# Patient Record
Sex: Female | Born: 1965 | Race: Black or African American | Hispanic: No | Marital: Single | Smoking: Never smoker
Health system: Southern US, Community
[De-identification: ages and names within clinical notes are randomized; demographics above are authoritative.]

## PROBLEM LIST (undated history)

## (undated) DIAGNOSIS — K589 Irritable bowel syndrome without diarrhea: Secondary | ICD-10-CM

## (undated) DIAGNOSIS — K5790 Diverticulosis of intestine, part unspecified, without perforation or abscess without bleeding: Secondary | ICD-10-CM

## (undated) DIAGNOSIS — K219 Gastro-esophageal reflux disease without esophagitis: Secondary | ICD-10-CM

## (undated) DIAGNOSIS — Z8742 Personal history of other diseases of the female genital tract: Secondary | ICD-10-CM

## (undated) DIAGNOSIS — Z87442 Personal history of urinary calculi: Secondary | ICD-10-CM

## (undated) DIAGNOSIS — F410 Panic disorder [episodic paroxysmal anxiety] without agoraphobia: Secondary | ICD-10-CM

## (undated) DIAGNOSIS — D219 Benign neoplasm of connective and other soft tissue, unspecified: Secondary | ICD-10-CM

## (undated) DIAGNOSIS — F419 Anxiety disorder, unspecified: Secondary | ICD-10-CM

## (undated) DIAGNOSIS — I10 Essential (primary) hypertension: Secondary | ICD-10-CM

## (undated) HISTORY — PX: BREAST CYST ASPIRATION: SHX578

## (undated) HISTORY — DX: Personal history of other diseases of the female genital tract: Z87.42

## (undated) HISTORY — PX: OTHER SURGICAL HISTORY: SHX169

## (undated) HISTORY — DX: Benign neoplasm of connective and other soft tissue, unspecified: D21.9

## (undated) HISTORY — DX: Essential (primary) hypertension: I10

---

## 1998-03-15 ENCOUNTER — Other Ambulatory Visit: Admission: RE | Admit: 1998-03-15 | Discharge: 1998-03-15 | Payer: Self-pay | Admitting: Obstetrics and Gynecology

## 1998-03-16 ENCOUNTER — Other Ambulatory Visit: Admission: RE | Admit: 1998-03-16 | Discharge: 1998-03-16 | Payer: Self-pay | Admitting: Obstetrics and Gynecology

## 1998-04-04 ENCOUNTER — Ambulatory Visit (HOSPITAL_COMMUNITY): Admission: RE | Admit: 1998-04-04 | Discharge: 1998-04-04 | Payer: Self-pay | Admitting: Obstetrics and Gynecology

## 1999-02-05 ENCOUNTER — Other Ambulatory Visit: Admission: RE | Admit: 1999-02-05 | Discharge: 1999-02-05 | Payer: Self-pay | Admitting: Obstetrics and Gynecology

## 1999-02-05 DIAGNOSIS — N870 Mild cervical dysplasia: Secondary | ICD-10-CM | POA: Insufficient documentation

## 1999-03-27 ENCOUNTER — Other Ambulatory Visit: Admission: RE | Admit: 1999-03-27 | Discharge: 1999-03-27 | Payer: Self-pay | Admitting: Obstetrics and Gynecology

## 1999-07-03 ENCOUNTER — Other Ambulatory Visit: Admission: RE | Admit: 1999-07-03 | Discharge: 1999-07-03 | Payer: Self-pay | Admitting: Obstetrics and Gynecology

## 1999-11-04 ENCOUNTER — Other Ambulatory Visit: Admission: RE | Admit: 1999-11-04 | Discharge: 1999-11-04 | Payer: Self-pay | Admitting: *Deleted

## 2000-02-13 ENCOUNTER — Other Ambulatory Visit: Admission: RE | Admit: 2000-02-13 | Discharge: 2000-02-13 | Payer: Self-pay | Admitting: Obstetrics and Gynecology

## 2000-05-20 ENCOUNTER — Other Ambulatory Visit: Admission: RE | Admit: 2000-05-20 | Discharge: 2000-05-20 | Payer: Self-pay | Admitting: Obstetrics & Gynecology

## 2001-02-15 ENCOUNTER — Other Ambulatory Visit: Admission: RE | Admit: 2001-02-15 | Discharge: 2001-02-15 | Payer: Self-pay | Admitting: Obstetrics and Gynecology

## 2002-02-15 ENCOUNTER — Other Ambulatory Visit: Admission: RE | Admit: 2002-02-15 | Discharge: 2002-02-15 | Payer: Self-pay | Admitting: Obstetrics and Gynecology

## 2003-02-28 ENCOUNTER — Other Ambulatory Visit: Admission: RE | Admit: 2003-02-28 | Discharge: 2003-02-28 | Payer: Self-pay | Admitting: Obstetrics and Gynecology

## 2006-06-01 ENCOUNTER — Ambulatory Visit (HOSPITAL_COMMUNITY): Admission: RE | Admit: 2006-06-01 | Discharge: 2006-06-01 | Payer: Self-pay | Admitting: Obstetrics and Gynecology

## 2007-06-03 ENCOUNTER — Ambulatory Visit (HOSPITAL_COMMUNITY): Admission: RE | Admit: 2007-06-03 | Discharge: 2007-06-03 | Payer: Self-pay | Admitting: Obstetrics and Gynecology

## 2008-06-08 ENCOUNTER — Ambulatory Visit (HOSPITAL_COMMUNITY): Admission: RE | Admit: 2008-06-08 | Discharge: 2008-06-08 | Payer: Self-pay | Admitting: Obstetrics and Gynecology

## 2009-05-15 ENCOUNTER — Ambulatory Visit: Payer: Self-pay | Admitting: Gastroenterology

## 2009-06-08 ENCOUNTER — Ambulatory Visit (HOSPITAL_COMMUNITY): Admission: RE | Admit: 2009-06-08 | Discharge: 2009-06-08 | Payer: Self-pay | Admitting: Obstetrics and Gynecology

## 2010-07-09 ENCOUNTER — Ambulatory Visit (HOSPITAL_COMMUNITY): Admission: RE | Admit: 2010-07-09 | Discharge: 2010-07-09 | Payer: Self-pay | Admitting: Obstetrics and Gynecology

## 2011-07-21 ENCOUNTER — Other Ambulatory Visit (HOSPITAL_COMMUNITY): Payer: Self-pay | Admitting: Obstetrics and Gynecology

## 2011-07-21 DIAGNOSIS — Z1231 Encounter for screening mammogram for malignant neoplasm of breast: Secondary | ICD-10-CM

## 2011-07-25 ENCOUNTER — Ambulatory Visit (HOSPITAL_COMMUNITY)
Admission: RE | Admit: 2011-07-25 | Discharge: 2011-07-25 | Disposition: A | Discharge status: Home / Self Care | Payer: 59 | Source: Ambulatory Visit | Attending: Obstetrics and Gynecology | Admitting: Obstetrics and Gynecology

## 2011-07-25 DIAGNOSIS — Z1231 Encounter for screening mammogram for malignant neoplasm of breast: Secondary | ICD-10-CM | POA: Insufficient documentation

## 2012-06-16 ENCOUNTER — Ambulatory Visit (INDEPENDENT_AMBULATORY_CARE_PROVIDER_SITE_OTHER): Payer: 59 | Admitting: Obstetrics and Gynecology

## 2012-06-16 ENCOUNTER — Encounter: Payer: Self-pay | Admitting: Obstetrics and Gynecology

## 2012-06-16 VITALS — BP 124/82 | Resp 16 | Ht 66.0 in | Wt 204.0 lb

## 2012-06-16 DIAGNOSIS — I10 Essential (primary) hypertension: Secondary | ICD-10-CM | POA: Insufficient documentation

## 2012-06-16 DIAGNOSIS — Z124 Encounter for screening for malignant neoplasm of cervix: Secondary | ICD-10-CM

## 2012-06-16 DIAGNOSIS — Z8742 Personal history of other diseases of the female genital tract: Secondary | ICD-10-CM | POA: Insufficient documentation

## 2012-06-16 DIAGNOSIS — Z01419 Encounter for gynecological examination (general) (routine) without abnormal findings: Secondary | ICD-10-CM

## 2012-06-16 DIAGNOSIS — E669 Obesity, unspecified: Secondary | ICD-10-CM

## 2012-06-16 DIAGNOSIS — D219 Benign neoplasm of connective and other soft tissue, unspecified: Secondary | ICD-10-CM | POA: Insufficient documentation

## 2012-06-16 DIAGNOSIS — R339 Retention of urine, unspecified: Secondary | ICD-10-CM

## 2012-06-16 NOTE — Addendum Note (Signed)
Addended by: Tim Lair on: 06/16/2012 06:09 PM   Modules accepted: Orders

## 2012-06-16 NOTE — Progress Notes (Signed)
Regular Periods: no Mammogram: Due 07/2012  Monthly Breast Ex.: yes Exercise: yes  Tetanus < 10 years: no Seatbelts: yes  NI. Bladder Functn.: yes Abuse at home: no  Daily BM's: yes Stressful Work: yes  Healthy Diet: yes Sigmoid-Colonoscopy: "2009" per pt WNL  Calcium: no Medical problems this year: Breast pain x 4-5 Weeks.    LAST PAP:06/2011 WNL  Contraception: Mirena  Mammogram:  07/25/2011  PCP: Dr.Hedrick [facility]  PMH: No Changes  FMH: No Changes  Last Bone Scan: N/A Subjective:    Chelsea Neal is a 46 y.o. female, No obstetric history on file., who presents for an annual exam. See above. She had a Mirena IUD placed in July of 2010.  She has a history of hypertension.  She was to rule out sexual transmitted infections.  She complains of urinary retention.  She denies dysuria, and hematuria.  Prior Hysterectomy: No    History   Social History  . Marital Status: Single    Spouse Name: N/A    Number of Children: N/A  . Years of Education: N/A   Social History Main Topics  . Smoking status: Never Smoker   . Smokeless tobacco: Never Used  . Alcohol Use: Yes  . Drug Use: No  . Sexually Active: None   Other Topics Concern  . None   Social History Narrative  . None    Menstrual cycle:   LMP: No LMP recorded. Patient is not currently having periods (Reason: IUD).           Cycle: No period with Mirena  The following portions of the patient's history were reviewed and updated as appropriate: allergies, current medications, past family history, past medical history, past social history, past surgical history and problem list.  Review of Systems Pertinent items are noted in HPI. Breast:Negative for breast lump,nipple discharge or nipple retraction Gastrointestinal: Negative for abdominal pain, change in bowel habits or rectal bleeding Urinary:negative   Objective:    BP 124/82  Resp 16  Ht 5\' 6"  (1.676 m)  Wt 204 lb (92.534 kg)  BMI 32.93 kg/m2    Weight:  Wt Readings from Last 1 Encounters:  06/16/12 204 lb (92.534 kg)          BMI: Body mass index is 32.93 kg/(m^2).  General Appearance: Alert, appropriate appearance for age. No acute distress HEENT: Grossly normal Neck / Thyroid: Supple, no masses, nodes or enlargement Lungs: clear to auscultation bilaterally Back: No CVA tenderness Breast Exam: No masses or nodes.No dimpling, nipple retraction or discharge. Cardiovascular: Regular rate and rhythm. S1, S2, no murmur Gastrointestinal: Soft, non-tender, no masses or organomegaly  ++++++++++++++++++++++++++++++++++++++++++++++++++++++++  Pelvic Exam: External genitalia: normal general appearance Vaginal: normal without tenderness, induration or masses and relaxation noted Cervix: normal appearance, IUD string present Adnexa: normal bimanual exam Uterus: 8 week size, irregular Rectovaginal: normal rectal, no masses  ++++++++++++++++++++++++++++++++++++++++++++++++++++++++  Lymphatic Exam: Non-palpable nodes in neck, clavicular, axillary, or inguinal regions Neurologic: Normal speech, no tremor  Psychiatric: Alert and oriented, appropriate affect.   Wet Prep:not applicable Urinalysis:not applicable UPT: Not done   Assessment:    Normal gyn exam   Overweight or obese: Yes   Pelvic relaxation: Yes  Fibroid uterus  Urinary retention   Plan:    mammogram pap smear return annually or prn Contraception:IUD  Urine culture sent   STD screen request: GC, chlamydia  RPR: No.   HBsAg: No.  Hepatitis C: No.  The updated Pap smear screening guidelines were  discussed with the patient. The patient requested that I obtain a Pap smear: Yes.  Kegel exercises discussed: Yes.  Proper diet and regular exercise were reviewed.  Annual mammograms recommended starting at age 66. Proper breast care was discussed.  Screening colonoscopy is recommended beginning at age 39.  Regular health maintenance was  reviewed.  Sleep hygiene was discussed.  Adequate calcium and vitamin D intake was emphasized.  Mylinda Latina.D.

## 2012-06-20 LAB — PAP IG, CT-NG, RFX HPV ASCU

## 2012-07-19 ENCOUNTER — Other Ambulatory Visit: Payer: Self-pay | Admitting: Obstetrics and Gynecology

## 2012-07-19 DIAGNOSIS — Z1231 Encounter for screening mammogram for malignant neoplasm of breast: Secondary | ICD-10-CM

## 2012-08-03 ENCOUNTER — Ambulatory Visit (HOSPITAL_COMMUNITY)
Admission: RE | Admit: 2012-08-03 | Discharge: 2012-08-03 | Disposition: A | Discharge status: Home / Self Care | Payer: 59 | Source: Ambulatory Visit | Attending: Obstetrics and Gynecology | Admitting: Obstetrics and Gynecology

## 2012-08-03 DIAGNOSIS — Z1231 Encounter for screening mammogram for malignant neoplasm of breast: Secondary | ICD-10-CM | POA: Insufficient documentation

## 2012-08-10 ENCOUNTER — Encounter: Payer: Self-pay | Admitting: Obstetrics and Gynecology

## 2013-08-15 ENCOUNTER — Other Ambulatory Visit: Payer: Self-pay | Admitting: Obstetrics and Gynecology

## 2013-08-15 DIAGNOSIS — Z1231 Encounter for screening mammogram for malignant neoplasm of breast: Secondary | ICD-10-CM

## 2013-08-23 ENCOUNTER — Other Ambulatory Visit: Payer: Self-pay | Admitting: Obstetrics and Gynecology

## 2013-08-23 ENCOUNTER — Ambulatory Visit (HOSPITAL_COMMUNITY)
Admission: RE | Admit: 2013-08-23 | Discharge: 2013-08-23 | Disposition: A | Discharge status: Home / Self Care | Payer: 59 | Source: Ambulatory Visit | Attending: Obstetrics and Gynecology | Admitting: Obstetrics and Gynecology

## 2013-08-23 ENCOUNTER — Ambulatory Visit (HOSPITAL_COMMUNITY): Payer: 59

## 2013-08-23 DIAGNOSIS — Z1231 Encounter for screening mammogram for malignant neoplasm of breast: Secondary | ICD-10-CM

## 2014-06-30 ENCOUNTER — Ambulatory Visit: Payer: Self-pay | Admitting: Family Medicine

## 2014-08-04 ENCOUNTER — Ambulatory Visit: Payer: Self-pay | Admitting: Gastroenterology

## 2014-08-22 ENCOUNTER — Other Ambulatory Visit (HOSPITAL_COMMUNITY): Payer: Self-pay | Admitting: Obstetrics and Gynecology

## 2014-08-22 DIAGNOSIS — Z1231 Encounter for screening mammogram for malignant neoplasm of breast: Secondary | ICD-10-CM

## 2014-08-25 ENCOUNTER — Ambulatory Visit (HOSPITAL_COMMUNITY)
Admission: RE | Admit: 2014-08-25 | Discharge: 2014-08-25 | Disposition: A | Discharge status: Home / Self Care | Payer: 59 | Source: Ambulatory Visit | Attending: Obstetrics and Gynecology | Admitting: Obstetrics and Gynecology

## 2014-08-25 DIAGNOSIS — Z1231 Encounter for screening mammogram for malignant neoplasm of breast: Secondary | ICD-10-CM | POA: Insufficient documentation

## 2015-08-24 ENCOUNTER — Other Ambulatory Visit (HOSPITAL_COMMUNITY): Payer: Self-pay | Admitting: Obstetrics and Gynecology

## 2015-08-24 DIAGNOSIS — Z1231 Encounter for screening mammogram for malignant neoplasm of breast: Secondary | ICD-10-CM

## 2015-08-28 ENCOUNTER — Ambulatory Visit (HOSPITAL_COMMUNITY)
Admission: RE | Admit: 2015-08-28 | Discharge: 2015-08-28 | Disposition: A | Discharge status: Home / Self Care | Payer: 59 | Source: Ambulatory Visit | Attending: Obstetrics and Gynecology | Admitting: Obstetrics and Gynecology

## 2015-08-28 ENCOUNTER — Other Ambulatory Visit (HOSPITAL_COMMUNITY): Payer: Self-pay | Admitting: Obstetrics and Gynecology

## 2015-08-28 DIAGNOSIS — Z1231 Encounter for screening mammogram for malignant neoplasm of breast: Secondary | ICD-10-CM | POA: Insufficient documentation

## 2016-08-01 ENCOUNTER — Emergency Department
Admission: EM | Admit: 2016-08-01 | Discharge: 2016-08-01 | Disposition: A | Discharge status: Home / Self Care | Payer: 59 | Attending: Emergency Medicine | Admitting: Emergency Medicine

## 2016-08-01 ENCOUNTER — Emergency Department: Payer: 59

## 2016-08-01 DIAGNOSIS — F419 Anxiety disorder, unspecified: Secondary | ICD-10-CM | POA: Diagnosis not present

## 2016-08-01 DIAGNOSIS — R0789 Other chest pain: Secondary | ICD-10-CM | POA: Diagnosis present

## 2016-08-01 DIAGNOSIS — I10 Essential (primary) hypertension: Secondary | ICD-10-CM | POA: Diagnosis not present

## 2016-08-01 DIAGNOSIS — R079 Chest pain, unspecified: Secondary | ICD-10-CM | POA: Diagnosis not present

## 2016-08-01 DIAGNOSIS — R200 Anesthesia of skin: Secondary | ICD-10-CM | POA: Insufficient documentation

## 2016-08-01 LAB — COMPREHENSIVE METABOLIC PANEL
ALBUMIN: 4.5 g/dL (ref 3.5–5.0)
ALT: 65 U/L — AB (ref 14–54)
AST: 38 U/L (ref 15–41)
Alkaline Phosphatase: 85 U/L (ref 38–126)
Anion gap: 6 (ref 5–15)
BILIRUBIN TOTAL: 0.8 mg/dL (ref 0.3–1.2)
BUN: 15 mg/dL (ref 6–20)
CO2: 27 mmol/L (ref 22–32)
CREATININE: 0.9 mg/dL (ref 0.44–1.00)
Calcium: 9.4 mg/dL (ref 8.9–10.3)
Chloride: 105 mmol/L (ref 101–111)
GFR calc Af Amer: >60 mL/min (ref >60)
GLUCOSE: 102 mg/dL — AB (ref 65–99)
POTASSIUM: 3.9 mmol/L (ref 3.5–5.1)
Sodium: 138 mmol/L (ref 135–145)
TOTAL PROTEIN: 8.3 g/dL — AB (ref 6.5–8.1)

## 2016-08-01 LAB — URINALYSIS COMPLETE WITH MICROSCOPIC (ARMC ONLY)
BACTERIA UA: NONE SEEN
SQUAMOUS EPITHELIAL / LPF: NONE SEEN
Specific Gravity, Urine: 1.015 (ref 1.005–1.030)
WBC, UA: NONE SEEN WBC/hpf (ref 0–5)

## 2016-08-01 LAB — CBC
HCT: 41.2 % (ref 35.0–47.0)
HEMOGLOBIN: 14.3 g/dL (ref 12.0–16.0)
MCH: 31.5 pg (ref 26.0–34.0)
MCHC: 34.6 g/dL (ref 32.0–36.0)
MCV: 90.9 fL (ref 80.0–100.0)
PLATELETS: 217 10*3/uL (ref 150–440)
RBC: 4.53 MIL/uL (ref 3.80–5.20)
RDW: 13.7 % (ref 11.5–14.5)
WBC: 5.3 10*3/uL (ref 3.6–11.0)

## 2016-08-01 LAB — TROPONIN I

## 2016-08-01 MED ORDER — LORAZEPAM 1 MG PO TABS: 1.0000 mg | ORAL_TABLET | Freq: Two times a day (BID) | ORAL | 0 refills | Status: AC | PRN

## 2016-08-01 NOTE — ED Provider Notes (Signed)
[facility] Emergency Department Provider Note   First MD Initiated Contact with Patient 08/01/16 [phone]     (approximate)  I have reviewed the triage vital signs and the nursing notes.   HISTORY  Chief Complaint Chest Pain and Numbness    HPI Chelsea Neal is a 50 y.o. female presents with chest tightness bilateral upper and lower extremity "numbness and tingling" intermittently, with hot flashes, feeling anxious 4 days. Patient states that she is under" a lot of stress" and feels as though her symptoms are secondary to anxiety.  Past Medical History:  Diagnosis Date  . Fibroids   . H/O menorrhagia   . Hypertension     Patient Active Problem List   Diagnosis Date Noted  . Obesity 06/16/2012  . Fibroids   . Hypertension   . H/O menorrhagia     Past Surgical History:  Procedure Laterality Date  . bucal tumor removal    . hammer toe repair      Prior to Admission medications   Medication Sig Start Date End Date Taking? Authorizing Provider  levonorgestrel (MIRENA) 20 MCG/24HR IUD 1 each by Intrauterine route once.    Historical Provider, MD    Allergies Latex  Family History  Problem Relation Age of Onset  . Cancer Maternal Grandmother     Breast     Social History Social History  Substance Use Topics  . Smoking status: Never Smoker  . Smokeless tobacco: Never Used  . Alcohol use Yes    Review of Systems Constitutional: No fever/chills Eyes: No visual changes. ENT: No sore throat. Cardiovascular: Positive for chest pain. Respiratory: Denies shortness of breath. Gastrointestinal: No abdominal pain.  No nausea, no vomiting.  No diarrhea.  No constipation. Genitourinary: Negative for dysuria. Musculoskeletal: Negative for back pain. Skin: Negative for rash. Neurological: Negative for headaches, focal weakness or numbness. Psychiatric:Positive for "anxiety"  10-point ROS otherwise  negative.  ____________________________________________   PHYSICAL EXAM:  VITAL SIGNS: ED Triage Vitals  Enc Vitals Group     BP 08/01/16 0012 (!) 141/86     Pulse Rate 08/01/16 0012 87     Resp 08/01/16 0012 18     Temp 08/01/16 0012 98.4 F (36.9 C)     Temp Source 08/01/16 0012 Oral     SpO2 08/01/16 0012 100 %     Weight 08/01/16 0009 195 lb (88.5 kg)     Height 08/01/16 0009 5\' 5"  (1.651 m)     Head Circumference --      Peak Flow --      Pain Score 08/01/16 0009 7     Pain Loc --      Pain Edu? --      Excl. in [place]? --     Constitutional: Alert and oriented. Well appearing and in no acute distress. Eyes: Conjunctivae are normal. PERRL. EOMI. Head: Atraumatic. Mouth/Throat: Mucous membranes are moist.  Oropharynx non-erythematous. Neck: No stridor.  No meningeal signs.   Cardiovascular: Normal rate, regular rhythm. Good peripheral circulation. Grossly normal heart sounds. Respiratory: Normal respiratory effort.  No retractions. Lungs CTAB. Gastrointestinal: Soft and nontender. No distention.  Musculoskeletal: No lower extremity tenderness nor edema. No gross deformities of extremities. Neurologic:  Normal speech and language. No gross focal neurologic deficits are appreciated.  Skin:  Skin is warm, dry and intact. No rash noted. Psychiatric: Anxious affect. Speech and behavior are normal.  ____________________________________________   LABS (all labs ordered are listed, but only abnormal  results are displayed)  Labs Reviewed  URINALYSIS COMPLETEWITH MICROSCOPIC ([place]) - Abnormal; Notable for the following:       Result Value   Color, Urine RED (*)    APPearance CLOUDY (*)    Glucose, UA   (*)    Value: TEST NOT REPORTED DUE TO COLOR INTERFERENCE OF URINE PIGMENT   Bilirubin Urine   (*)    Value: TEST NOT REPORTED DUE TO COLOR INTERFERENCE OF URINE PIGMENT   Ketones, ur   (*)    Value: TEST NOT REPORTED DUE TO COLOR INTERFERENCE OF URINE PIGMENT   Hgb  urine dipstick   (*)    Value: TEST NOT REPORTED DUE TO COLOR INTERFERENCE OF URINE PIGMENT   Protein, ur   (*)    Value: TEST NOT REPORTED DUE TO COLOR INTERFERENCE OF URINE PIGMENT   Nitrite   (*)    Value: TEST NOT REPORTED DUE TO COLOR INTERFERENCE OF URINE PIGMENT   Leukocytes, UA   (*)    Value: TEST NOT REPORTED DUE TO COLOR INTERFERENCE OF URINE PIGMENT   All other components within normal limits  COMPREHENSIVE METABOLIC PANEL - Abnormal; Notable for the following:    Glucose, Bld 102 (*)    Total Protein 8.3 (*)    ALT 65 (*)    All other components within normal limits  CBC  TROPONIN I  TROPONIN I   ____________________________________________  EKG  ED ECG REPORT I, [place] N Adler [place], the attending physician, personally viewed and interpreted this ECG.   Date: 08/01/2016  EKG Time: 12:12 AM  Rate: 88  Rhythm: Normal sinus rhythm  Axis: Normal  Intervals: Normal  ST&T Change: None  ____________________________________________  RADIOLOGY I, [place] N Giani Betzold, personally viewed and evaluated these images (plain radiographs) as part of my medical decision making, as well as reviewing the written report by the radiologist.  Dg Chest 2 View  Result Date: 08/01/2016 CLINICAL DATA:  Chest pain and bilateral leg numbness since last Sunday. EXAM: CHEST  2 VIEW COMPARISON:  None. FINDINGS: The heart size and mediastinal contours are within normal limits. Both lungs are clear. The visualized skeletal structures are unremarkable. IMPRESSION: No active cardiopulmonary disease. Electronically Signed   By: Lucienne Capers M.D.   On: 08/01/2016 00:41     Procedures     INITIAL IMPRESSION / ASSESSMENT AND PLAN / ED COURSE  Pertinent labs & imaging results that were available during my care of the patient were reviewed by me and considered in my medical decision making (see chart for details).  Given history and physical exam considered a possibility of cardiac etiology for  the patient's chest pain however EKG revealed no evidence of ischemia or infarction laboratory data negative including troponin 2.   Clinical Course    ____________________________________________  FINAL CLINICAL IMPRESSION(S) / ED DIAGNOSES  Final diagnoses:  Anxiety  Chest pain, unspecified chest pain type     MEDICATIONS GIVEN DURING THIS VISIT:  Medications - No data to display   NEW OUTPATIENT MEDICATIONS STARTED DURING THIS VISIT:  New Prescriptions   No medications on file      Note:  This document was prepared using Dragon voice recognition software and may include unintentional dictation errors.    Gregor Hams, MD 08/01/16 [phone]

## 2016-08-01 NOTE — ED Triage Notes (Signed)
Pt presents to ED w/ c/o chest pain and bilateral leg numbness since last Sunday. Pt states she thought s/x's were related to "hot flashes". Pt went to [facility] on Tuesday dx/'d with Acid Reflux (was given GI cocktail and 1 Prilosec daily x1 week). Pt reports medications helped but similar s/x's have returned. Pt denies N/V, denies SHOB.

## 2016-09-04 ENCOUNTER — Other Ambulatory Visit: Payer: Self-pay | Admitting: Obstetrics and Gynecology

## 2016-09-04 DIAGNOSIS — Z1231 Encounter for screening mammogram for malignant neoplasm of breast: Secondary | ICD-10-CM

## 2016-09-17 ENCOUNTER — Emergency Department: Payer: 59

## 2016-09-17 ENCOUNTER — Encounter: Payer: Self-pay | Admitting: Emergency Medicine

## 2016-09-17 ENCOUNTER — Ambulatory Visit
Admission: RE | Admit: 2016-09-17 | Discharge: 2016-09-17 | Disposition: A | Discharge status: Home / Self Care | Payer: 59 | Source: Ambulatory Visit | Attending: Obstetrics and Gynecology | Admitting: Obstetrics and Gynecology

## 2016-09-17 ENCOUNTER — Emergency Department
Admission: EM | Admit: 2016-09-17 | Discharge: 2016-09-17 | Disposition: A | Discharge status: Home / Self Care | Payer: 59 | Attending: Emergency Medicine | Admitting: Emergency Medicine

## 2016-09-17 DIAGNOSIS — Z91040 Latex allergy status: Secondary | ICD-10-CM | POA: Insufficient documentation

## 2016-09-17 DIAGNOSIS — R1013 Epigastric pain: Secondary | ICD-10-CM | POA: Insufficient documentation

## 2016-09-17 DIAGNOSIS — R112 Nausea with vomiting, unspecified: Secondary | ICD-10-CM | POA: Insufficient documentation

## 2016-09-17 DIAGNOSIS — I10 Essential (primary) hypertension: Secondary | ICD-10-CM | POA: Diagnosis not present

## 2016-09-17 DIAGNOSIS — Z79899 Other long term (current) drug therapy: Secondary | ICD-10-CM | POA: Diagnosis not present

## 2016-09-17 DIAGNOSIS — R1031 Right lower quadrant pain: Secondary | ICD-10-CM | POA: Insufficient documentation

## 2016-09-17 DIAGNOSIS — R1011 Right upper quadrant pain: Secondary | ICD-10-CM | POA: Diagnosis not present

## 2016-09-17 DIAGNOSIS — R109 Unspecified abdominal pain: Secondary | ICD-10-CM

## 2016-09-17 DIAGNOSIS — Z1231 Encounter for screening mammogram for malignant neoplasm of breast: Secondary | ICD-10-CM

## 2016-09-17 LAB — COMPREHENSIVE METABOLIC PANEL
ALT: 19 U/L (ref 14–54)
ANION GAP: 10 (ref 5–15)
AST: 26 U/L (ref 15–41)
Albumin: 4.1 g/dL (ref 3.5–5.0)
Alkaline Phosphatase: 93 U/L (ref 38–126)
BILIRUBIN TOTAL: 0.8 mg/dL (ref 0.3–1.2)
BUN: 12 mg/dL (ref 6–20)
CO2: 24 mmol/L (ref 22–32)
Calcium: 9.3 mg/dL (ref 8.9–10.3)
Chloride: 104 mmol/L (ref 101–111)
Creatinine, Ser: 0.95 mg/dL (ref 0.44–1.00)
GFR calc Af Amer: >60 mL/min (ref >60)
Glucose, Bld: 106 mg/dL — ABNORMAL HIGH (ref 65–99)
POTASSIUM: 3.8 mmol/L (ref 3.5–5.1)
Sodium: 138 mmol/L (ref 135–145)
TOTAL PROTEIN: 8.1 g/dL (ref 6.5–8.1)

## 2016-09-17 LAB — CBC WITH DIFFERENTIAL/PLATELET
Basophils Absolute: 0 10*3/uL (ref 0–0.1)
Basophils Relative: 0 %
Eosinophils Absolute: 0 10*3/uL (ref 0–0.7)
Eosinophils Relative: 0 %
HEMATOCRIT: 41 % (ref 35.0–47.0)
Hemoglobin: 14.1 g/dL (ref 12.0–16.0)
LYMPHS PCT: 11 %
Lymphs Abs: 1.2 10*3/uL (ref 1.0–3.6)
MCH: 30.4 pg (ref 26.0–34.0)
MCHC: 34.5 g/dL (ref 32.0–36.0)
MCV: 88.3 fL (ref 80.0–100.0)
MONO ABS: 0.8 10*3/uL (ref 0.2–0.9)
MONOS PCT: 7 %
NEUTROS ABS: 9.4 10*3/uL — AB (ref 1.4–6.5)
Neutrophils Relative %: 82 %
Platelets: 240 10*3/uL (ref 150–440)
RBC: 4.64 MIL/uL (ref 3.80–5.20)
RDW: 13.1 % (ref 11.5–14.5)
WBC: 11.4 10*3/uL — ABNORMAL HIGH (ref 3.6–11.0)

## 2016-09-17 LAB — URINALYSIS COMPLETE WITH MICROSCOPIC (ARMC ONLY)
BILIRUBIN URINE: NEGATIVE
Bacteria, UA: NONE SEEN
Glucose, UA: 50 mg/dL — AB
Hgb urine dipstick: NEGATIVE
KETONES UR: NEGATIVE mg/dL
Leukocytes, UA: NEGATIVE
Nitrite: NEGATIVE
PH: 6 (ref 5.0–8.0)
Protein, ur: 30 mg/dL — AB
Specific Gravity, Urine: 1.018 (ref 1.005–1.030)

## 2016-09-17 LAB — LIPASE, BLOOD: LIPASE: 17 U/L (ref 11–51)

## 2016-09-17 MED ORDER — IOPAMIDOL (ISOVUE-300) INJECTION 61%
100.0000 mL | Freq: Once | INTRAVENOUS | Status: AC | PRN
  Administered 2016-09-17: 100 mL via INTRAVENOUS

## 2016-09-17 MED ORDER — METOCLOPRAMIDE HCL 10 MG PO TABS: 10.0000 mg | ORAL_TABLET | Freq: Four times a day (QID) | ORAL | 0 refills | Status: DC | PRN

## 2016-09-17 MED ORDER — FAMOTIDINE 20 MG PO TABS: 20.0000 mg | ORAL_TABLET | Freq: Two times a day (BID) | ORAL | 0 refills | Status: DC

## 2016-09-17 MED ORDER — ONDANSETRON HCL 4 MG/2ML IJ SOLN
INTRAMUSCULAR | Status: AC
  Administered 2016-09-17: 4 mg
  Filled 2016-09-17: qty 2

## 2016-09-17 MED ORDER — MORPHINE SULFATE (PF) 2 MG/ML IV SOLN
INTRAVENOUS | Status: AC
  Administered 2016-09-17: 4 mg via INTRAVENOUS
  Filled 2016-09-17: qty 2

## 2016-09-17 MED ORDER — SUCRALFATE 1 G PO TABS: 1.0000 g | ORAL_TABLET | Freq: Four times a day (QID) | ORAL | 1 refills | Status: DC

## 2016-09-17 MED ORDER — IOPAMIDOL (ISOVUE-300) INJECTION 61%
30.0000 mL | Freq: Once | INTRAVENOUS | Status: AC
  Administered 2016-09-17: 30 mL via ORAL

## 2016-09-17 NOTE — ED Provider Notes (Signed)
[facility] Emergency Department Provider Note  ____________________________________________  Time seen: Approximately 4:17 PM  I have reviewed the triage vital signs and the nursing notes.   HISTORY  Chief Complaint Abdominal Pain    HPI Chelsea Neal is a 50 y.o. female who complains of right-sided abdominal pain that started yesterday afternoon. Associated with nausea and vomiting. Worse with eating and drinking. She's not been able to eat anything today. Was seen yesterday at primary care who noted she had a leukocytosis and advised to come to the ED for CT scan. As any significant surgical history medical history. Pain is nonradiating, no alleviating factors. Moderate intensity, aching and constant.     Past Medical History:  Diagnosis Date  . Fibroids   . H/O menorrhagia   . Hypertension      Patient Active Problem List   Diagnosis Date Noted  . Obesity 06/16/2012  . Fibroids   . Hypertension   . H/O menorrhagia      Past Surgical History:  Procedure Laterality Date  . bucal tumor removal    . hammer toe repair       Prior to Admission medications   Medication Sig Start Date End Date Taking? Authorizing Provider  acidophilus (RISAQUAD) CAPS capsule Take by mouth daily.   Yes Historical Provider, MD  calcium carbonate (OS-CAL - DOSED IN MG OF ELEMENTAL CALCIUM) 1250 (500 Ca) MG tablet Take 2 tablets by mouth.   Yes Historical Provider, MD  dexlansoprazole (DEXILANT) 60 MG capsule take 1 capsule by mouth 30-60 MINUTES PRIOR TO [place] 08/20/16  Yes Historical Provider, MD  Multiple Vitamin (MULTIVITAMIN) capsule Take 1 capsule by mouth daily.   Yes Historical Provider, MD  famotidine (PEPCID) 20 MG tablet Take 1 tablet (20 mg total) by mouth 2 (two) times daily. 09/17/16   Carrie Mew, MD  metoCLOPramide (REGLAN) 10 MG tablet Take 1 tablet (10 mg total) by mouth every 6 (six) hours as needed. 09/17/16   Carrie Mew, MD   sucralfate (CARAFATE) 1 g tablet Take 1 tablet (1 g total) by mouth 4 (four) times daily. 09/17/16   Carrie Mew, MD     Allergies Latex   Family History  Problem Relation Age of Onset  . Cancer Maternal Grandmother     Breast     Social History Social History  Substance Use Topics  . Smoking status: Never Smoker  . Smokeless tobacco: Never Used  . Alcohol use Yes    Review of Systems  Constitutional:   No fever or chills.  ENT:   No sore throat. No rhinorrhea. Cardiovascular:   No chest pain. Respiratory:   No dyspnea or cough. Gastrointestinal:   Positive abdominal pain with vomiting.  Genitourinary:   Negative for dysuria or difficulty urinating. 10-point ROS otherwise negative.  ____________________________________________   PHYSICAL EXAM:  VITAL SIGNS: ED Triage Vitals  Enc Vitals Group     BP 09/17/16 1013 (!) 144/86     Pulse Rate 09/17/16 1013 (!) 59     Resp 09/17/16 1013 18     Temp 09/17/16 1013 97.8 F (36.6 C)     Temp Source 09/17/16 1013 Oral     SpO2 09/17/16 1013 100 %     Weight 09/17/16 1014 196 lb (88.9 kg)     Height 09/17/16 1014 5\' 6"  (1.676 m)     Head Circumference --      Peak Flow --      Pain Score 09/17/16  1014 9     Pain Loc --      Pain Edu? --      Excl. in [place]? --     Vital signs reviewed, nursing assessments reviewed.   Constitutional:   Alert and oriented. Well appearing and in no distress. Eyes:   No scleral icterus. No conjunctival pallor. PERRL. EOMI.  No nystagmus. ENT   Head:   Normocephalic and atraumatic.   Nose:   No congestion/rhinnorhea. No septal hematoma   Mouth/Throat:   MMM, no pharyngeal erythema. No peritonsillar mass.    Neck:   No stridor. No SubQ emphysema. No meningismus. Hematological/Lymphatic/Immunilogical:   No cervical lymphadenopathy. Cardiovascular:   RRR. Symmetric bilateral radial and DP pulses.  No murmurs.  Respiratory:   Normal respiratory effort without tachypnea  nor retractions. Breath sounds are clear and equal bilaterally. No wheezes/rales/rhonchi.Chest wall nontender Gastrointestinal:   Soft With diffuse abdominal tenderness in the right upper right lower and epigastric areas.. Non distended. There is no CVA tenderness.  No rebound, rigidity, or guarding. Genitourinary:   deferred Musculoskeletal:   Nontender with normal range of motion in all extremities. No joint effusions.  No lower extremity tenderness.  No edema. Neurologic:   Normal speech and language.  CN 2-10 normal. Motor grossly intact. No gross focal neurologic deficits are appreciated.  Skin:    Skin is warm, dry and intact. No rash noted.  No petechiae, purpura, or bullae.  ____________________________________________    LABS (pertinent positives/negatives) (all labs ordered are listed, but only abnormal results are displayed) Labs Reviewed  URINALYSIS COMPLETEWITH MICROSCOPIC ([place]) - Abnormal; Notable for the following:       Result Value   Color, Urine YELLOW (*)    APPearance CLEAR (*)    Glucose, UA 50 (*)    Protein, ur 30 (*)    Squamous Epithelial / LPF 0-5 (*)    All other components within normal limits  CBC WITH DIFFERENTIAL/PLATELET - Abnormal; Notable for the following:    WBC 11.4 (*)    Neutro Abs 9.4 (*)    All other components within normal limits  COMPREHENSIVE METABOLIC PANEL - Abnormal; Notable for the following:    Glucose, Bld 106 (*)    All other components within normal limits  LIPASE, BLOOD  POC URINE PREG, ED   ____________________________________________   EKG    ____________________________________________    RADIOLOGY  CT abdomen and pelvis unremarkable Ultrasound right upper quadrant pending  ____________________________________________   PROCEDURES Procedures  ____________________________________________   INITIAL IMPRESSION / ASSESSMENT AND PLAN / ED COURSE  Pertinent labs & imaging results that were available  during my care of the patient were reviewed by me and considered in my medical decision making (see chart for details).  Patient uncomfortable but not in distress. Given morphine and Zofran for symptom control. Initial workup with labs and CT scan was unrevealing, and on reassessment of the patient had about 3:30 PM she had persistent right upper quadrant tenderness. Ultrasound of the right upper quadrant was ordered to evaluate for gallstones and biliary colic. Case signed out to Dr. Karma Greaser to follow-up on the ultrasound.   Very low suspicion for perforation obstruction ischemia or cholangitis. No evidence of GU or pelvic pathology.   Clinical Course   ____________________________________________   FINAL CLINICAL IMPRESSION(S) / ED DIAGNOSES  Final diagnoses:  Right sided abdominal pain       Portions of this note were generated with dragon dictation software. Dictation errors may  occur despite best attempts at proofreading.    Carrie Mew, MD 09/17/16 [phone]

## 2016-09-17 NOTE — ED Notes (Signed)
Pt says pain started yesterday at 1500 at work and pt vomited and continued throughout the night. Pt went to the PCP and WBC were elevated. Pt has been on acid reflux medication started August 2017.

## 2016-09-17 NOTE — ED Provider Notes (Signed)
----------------------------------------- 4:06 PM on 09/17/2016 -----------------------------------------   Blood pressure 118/74, pulse 60, temperature 98 F (36.7 C), temperature source Oral, resp. rate 16, height 5\' 6"  (1.676 m), weight 88.9 kg, last menstrual period 09/09/2016, SpO2 99 %.  Assuming care from Dr. Joni Fears.  In short, Chelsea Neal is a 50 y.o. female with a chief complaint of Abdominal Pain .  Refer to the original H&P for additional details.  The current plan of care is to follow up U/S and disposition appropriately.   ----------------------------------------- 5:36 PM on 09/17/2016 -----------------------------------------   Ct Abdomen Pelvis W Contrast  Result Date: 09/17/2016 CLINICAL DATA:  Vomiting. Elevated white blood cell count. History of diverticulitis. EXAM: CT ABDOMEN AND PELVIS WITH CONTRAST TECHNIQUE: Multidetector CT imaging of the abdomen and pelvis was performed using the standard protocol following bolus administration of intravenous contrast. CONTRAST:  152mL ISOVUE-300 IOPAMIDOL (ISOVUE-300) INJECTION 61% COMPARISON:  None. FINDINGS: Lower chest: Limited visualization of the lower thorax demonstrates 2 punctate (approximately 3-5 mm) nodules within the right middle (image 3, series 4) and lower (image 8) lobes. Minimal subsegmental atelectasis within the imaged bilateral lung bases, left greater than right. No focal airspace opacities. No pleural effusion. Borderline cardiomegaly.  No pericardial effusion. Hepatobiliary: Normal hepatic contour. No discrete hepatic lesions. No intra extrahepatic biliary duct dilatation. No ascites. Normal appearance of the gallbladder. No radiopaque gallstones. Pancreas: Normal appearance of the pancreas. Spleen: Normal appearance of the spleen. Adrenals/Urinary Tract: There is slightly delayed enhancement and excretion of the right kidney secondary to a approximately 0.8 x 0.6 cm stone within the superior aspect of the  right ureter (coronal image 43, series 5) an additional punctate (approximately 0.5 cm) stone within the mid aspect of the right ureter which result in moderate upstream ureterectasis and pelvicaliectasis. No additional renal stones are identified. No evidence of left-sided urinary obstruction. Mild asymmetric left-sided perinephric stranding. Normal appearance of the urinary bladder given degree distention. Normal appearance of the bilateral adrenal glands. Stomach/Bowel: Ingested enteric contrast extensive the level of the mid/distal small bowel. Moderate to large colonic stool burden without evidence of enteric obstruction. Normal appearance of the terminal ileum and appendix. No pneumoperitoneum, pneumatosis or portal venous gas. Vascular/Lymphatic: Normal caliber of the abdominal aorta. The major branch vessels of the abdominal aorta appear patent on this non CTA examination. No bulky retroperitoneal, mesenteric, pelvic or inguinal lymphadenopathy. Reproductive: Myomatous uterus with dominant fibroid within the anterior aspect of the uterine body measuring approximately 5.4 x 4.2 cm (sagittal image 59, series 6). No discrete adnexal lesion. No free fluid in the pelvic cul-de-sac. Other: Regional soft tissues appear normal. Musculoskeletal: No acute or aggressive osseous abnormalities. IMPRESSION: 1. Punctate (approximately 0.8 and 0.5 cm) stones within superior and mid aspects of the right ureter result in moderate upstream ureterectasis and pelvicaliectasis. 2. No additional evidence of nephrolithiasis. No evidence of left-sided urinary obstruction. 3. Myomatous uterus. Electronically Signed   By: Sandi Mariscal M.D.   On: 09/17/2016 15:17   US Abdomen Limited Ruq  Result Date: 09/17/2016 CLINICAL DATA:  Right-sided abdominal pain EXAM: US ABDOMEN LIMITED - RIGHT UPPER QUADRANT COMPARISON:  CT from earlier in the same day. FINDINGS: Gallbladder: No gallstones or wall thickening visualized. No sonographic  Murphy sign noted by sonographer. Common bile duct: Diameter: 4.5 mm. Liver: No focal lesion identified. Within normal limits in parenchymal echogenicity. Incidental note is made of right-sided hydronephrosis similar to that seen on recent CT examination. IMPRESSION: Right hydronephrosis secondary to known right-sided calculi  No other focal abnormality is seen. Electronically Signed   By: Inez Catalina M.D.   On: 09/17/2016 16:51     U/S unremarkable.  Patient in NAD.  Discussed results, she agrees with outpt f/u.  Gave usual/customary return precautions.   Hinda Kehr, MD 09/17/16 [phone]

## 2016-09-17 NOTE — ED Notes (Signed)
Pt started 2nd bottle of oral contrast

## 2016-09-17 NOTE — ED Triage Notes (Signed)
C/o right side abd pain since yesterday. Vomiting yesterday. Sent from clinic in [place] with elevated WBC

## 2016-09-17 NOTE — Discharge Instructions (Signed)

## 2016-09-30 ENCOUNTER — Ambulatory Visit (INDEPENDENT_AMBULATORY_CARE_PROVIDER_SITE_OTHER): Payer: 59 | Admitting: Urology

## 2016-09-30 ENCOUNTER — Encounter: Payer: Self-pay | Admitting: Urology

## 2016-09-30 VITALS — BP 128/82 | HR 73 | Ht 66.0 in | Wt 192.8 lb

## 2016-09-30 DIAGNOSIS — R3129 Other microscopic hematuria: Secondary | ICD-10-CM | POA: Diagnosis not present

## 2016-09-30 DIAGNOSIS — K649 Unspecified hemorrhoids: Secondary | ICD-10-CM | POA: Insufficient documentation

## 2016-09-30 DIAGNOSIS — N201 Calculus of ureter: Secondary | ICD-10-CM | POA: Diagnosis not present

## 2016-09-30 DIAGNOSIS — N132 Hydronephrosis with renal and ureteral calculous obstruction: Secondary | ICD-10-CM

## 2016-09-30 DIAGNOSIS — K5790 Diverticulosis of intestine, part unspecified, without perforation or abscess without bleeding: Secondary | ICD-10-CM | POA: Insufficient documentation

## 2016-09-30 DIAGNOSIS — N63 Unspecified lump in unspecified breast: Secondary | ICD-10-CM | POA: Insufficient documentation

## 2016-09-30 DIAGNOSIS — J309 Allergic rhinitis, unspecified: Secondary | ICD-10-CM | POA: Insufficient documentation

## 2016-09-30 LAB — URINALYSIS, COMPLETE
Bilirubin, UA: NEGATIVE
GLUCOSE, UA: NEGATIVE
KETONES UA: NEGATIVE
Nitrite, UA: NEGATIVE
PROTEIN UA: NEGATIVE
SPEC GRAV UA: 1.01 (ref 1.005–1.030)
Urobilinogen, Ur: 0.2 mg/dL (ref 0.2–1.0)
pH, UA: 6 (ref 5.0–7.5)

## 2016-09-30 LAB — MICROSCOPIC EXAMINATION

## 2016-09-30 MED ORDER — TAMSULOSIN HCL 0.4 MG PO CAPS: 0.4000 mg | ORAL_CAPSULE | Freq: Every day | ORAL | 0 refills | Status: DC

## 2016-09-30 MED ORDER — OXYCODONE-ACETAMINOPHEN 10-325 MG PO TABS: 1.0000 | ORAL_TABLET | ORAL | 0 refills | Status: DC | PRN

## 2016-09-30 NOTE — Progress Notes (Signed)
09/30/2016 2:16 PM   Delmer Islam 1966/10/26 [id number]  Referring provider: Maryland Pink, MD [address] [facility] [place], [place] [address]  Chief Complaint  Patient presents with  . New Patient (Initial Visit)    kidney stone    HPI: Patient is a 50 year old African American female who presents today for nephrolithiasis.  Patient states the onset of the pain was two weeks ago.   It was aching, moderate intensity and constant.  The pain was located in the RUQ.    The pain was a 8/10.  Nothing made the pain better.   Eating made the pain worse.  She did have nausea and vomiting, but she denies fevers, chills and gross hematuria.    In the ED, she received morphine and Zofran.  UA noted 0-5 RBC's/hpf.  Serum creatinine was 0.95.   Prior serum creatinine 0.90.  CT scan with contrast noted two (approximately 0.8 and 0.5 cm) stones within superior and mid aspects of the right ureter result in moderate upstream ureterectasis and pelvicaliectasis. No additional evidence of nephrolithiasis. No evidence of left-sided urinary obstruction.  Myomatous uterus.  I have independently reviewed the films.     Today, she is having gnawing RUQ.  She is not experiencing fevers, chills, nausea or vomiting.   UA today noted 11-30 WBC's and 11-30 RBC's.    She have a prior history of stones.  One stone was present on an ultrasound in 2015.       PMH: Past Medical History:  Diagnosis Date  . Fibroids   . H/O menorrhagia   . Hypertension     Surgical History: Past Surgical History:  Procedure Laterality Date  . bucal tumor removal    . hammer toe repair      Home Medications:    Medication List       Accurate as of 09/30/16  2:16 PM. Always use your most recent med list.          acidophilus Caps capsule Take by mouth daily.   calcium carbonate 1250 (500 Ca) MG tablet Commonly known as:  OS-CAL - dosed in mg of elemental calcium Take 2 tablets by mouth.   DEXILANT 60 MG capsule Generic drug:  dexlansoprazole take 1 capsule by mouth 30-60 MINUTES PRIOR TO EVENING MEAL   famotidine 20 MG tablet Commonly known as:  PEPCID Take 1 tablet (20 mg total) by mouth 2 (two) times daily.   metoCLOPramide 10 MG tablet Commonly known as:  REGLAN Take 1 tablet (10 mg total) by mouth every 6 (six) hours as needed.   multivitamin capsule Take 1 capsule by mouth daily.   oxyCODONE-acetaminophen 10-325 MG tablet Commonly known as:  PERCOCET Take 1 tablet by mouth every 4 (four) hours as needed for pain.   sucralfate 1 g tablet Commonly known as:  CARAFATE Take 1 tablet (1 g total) by mouth 4 (four) times daily.   tamsulosin 0.4 MG Caps capsule Commonly known as:  FLOMAX Take 1 capsule (0.4 mg total) by mouth daily.   venlafaxine 37.5 MG tablet Commonly known as:  EFFEXOR Take by mouth.       Allergies:  Allergies  Allergen Reactions  . Latex Hives    Family History: Family History  Problem Relation Age of Onset  . Cancer Maternal Grandmother     Breast   . Prostate cancer Father   . Kidney cancer Neg Hx   . Bladder Cancer Neg Hx  Social History:  reports that she has never smoked. She has never used smokeless tobacco. She reports that she drinks alcohol. She reports that she does not use drugs.  ROS: UROLOGY Frequent Urination?: No Hard to postpone urination?: No Burning/pain with urination?: No Get up at night to urinate?: Yes Leakage of urine?: No Urine stream starts and stops?: No Trouble starting stream?: No Do you have to strain to urinate?: No Blood in urine?: No Urinary tract infection?: No Sexually transmitted disease?: No Injury to kidneys or bladder?: No Painful intercourse?: No Weak stream?: No Currently pregnant?: No Vaginal bleeding?: No Last menstrual period?: n  Gastrointestinal Nausea?: No Vomiting?: Yes Indigestion/heartburn?: Yes Diarrhea?: No Constipation?: Yes  Constitutional Fever:  No Night sweats?: No Weight loss?: Yes Fatigue?: No  Skin Skin rash/lesions?: No Itching?: No  Eyes Blurred vision?: No Double vision?: No  Ears/Nose/Throat Sore throat?: No Sinus problems?: No  Hematologic/Lymphatic Swollen glands?: No Easy bruising?: No  Cardiovascular Leg swelling?: No Chest pain?: No  Respiratory Cough?: No Shortness of breath?: No  Endocrine Excessive thirst?: No  Musculoskeletal Back pain?: Yes Joint pain?: No  Neurological Headaches?: No Dizziness?: No  Psychologic Depression?: No Anxiety?: No  Physical Exam: BP 128/82 (BP Location: Left Arm, Patient Position: Sitting, Cuff Size: Normal)   Pulse 73   Ht 5' 6"  (1.676 m)   Wt 192 lb 12.8 oz (87.5 kg)   LMP 09/09/2016 (Approximate)   BMI 31.12 kg/m   Constitutional: Well nourished. Alert and oriented, No acute distress. HEENT: [place] AT, moist mucus membranes. Trachea midline, no masses. Cardiovascular: No clubbing, cyanosis, or edema. Respiratory: Normal respiratory effort, no increased work of breathing. GI: Abdomen is soft, non tender, non distended, no abdominal masses. Liver and spleen not palpable.  No hernias appreciated.  Stool sample for occult testing is not indicated.   GU: No CVA tenderness.  No bladder fullness or masses.   Skin: No rashes, bruises or suspicious lesions. Lymph: No cervical or inguinal adenopathy. Neurologic: Grossly intact, no focal deficits, moving all 4 extremities. Psychiatric: Normal mood and affect.  Laboratory Data: Lab Results  Component Value Date   WBC 11.4 (H) 09/17/2016   HGB 14.1 09/17/2016   HCT 41.0 09/17/2016   MCV 88.3 09/17/2016   PLT 240 09/17/2016    Lab Results  Component Value Date   CREATININE 0.95 09/17/2016    Lab Results  Component Value Date   AST 26 09/17/2016   Lab Results  Component Value Date   ALT 19 09/17/2016    Urinalysis 11-30 WBC's and 11-30 RBC's.  See EPIC.    Pertinent Imaging: CLINICAL  DATA:  Vomiting. Elevated white blood cell count. History of diverticulitis.  EXAM: CT ABDOMEN AND PELVIS WITH CONTRAST  TECHNIQUE: Multidetector CT imaging of the abdomen and pelvis was performed using the standard protocol following bolus administration of intravenous contrast.  CONTRAST:  172m ISOVUE-300 IOPAMIDOL (ISOVUE-300) INJECTION 61%  COMPARISON:  None.  FINDINGS: Lower chest: Limited visualization of the lower thorax demonstrates 2 punctate (approximately 3-5 mm) nodules within the right middle (image 3, series 4) and lower (image 8) lobes. Minimal subsegmental atelectasis within the imaged bilateral lung bases, left greater than right. No focal airspace opacities. No pleural effusion.  Borderline cardiomegaly.  No pericardial effusion.  Hepatobiliary: Normal hepatic contour. No discrete hepatic lesions. No intra extrahepatic biliary duct dilatation. No ascites. Normal appearance of the gallbladder. No radiopaque gallstones.  Pancreas: Normal appearance of the pancreas.  Spleen: Normal appearance of the  spleen.  Adrenals/Urinary Tract: There is slightly delayed enhancement and excretion of the right kidney secondary to a approximately 0.8 x 0.6 cm stone within the superior aspect of the right ureter (coronal image 43, series 5) an additional punctate (approximately 0.5 cm) stone within the mid aspect of the right ureter which result in moderate upstream ureterectasis and pelvicaliectasis.  No additional renal stones are identified. No evidence of left-sided urinary obstruction. Mild asymmetric left-sided perinephric stranding.  Normal appearance of the urinary bladder given degree distention.  Normal appearance of the bilateral adrenal glands.  Stomach/Bowel: Ingested enteric contrast extensive the level of the mid/distal small bowel. Moderate to large colonic stool burden without evidence of enteric obstruction. Normal appearance of  the terminal ileum and appendix. No pneumoperitoneum, pneumatosis or portal venous gas.  Vascular/Lymphatic: Normal caliber of the abdominal aorta. The major branch vessels of the abdominal aorta appear patent on this non CTA examination. No bulky retroperitoneal, mesenteric, pelvic or inguinal lymphadenopathy.  Reproductive: Myomatous uterus with dominant fibroid within the anterior aspect of the uterine body measuring approximately 5.4 x 4.2 cm (sagittal image 59, series 6). No discrete adnexal lesion. No free fluid in the pelvic cul-de-sac.  Other: Regional soft tissues appear normal.  Musculoskeletal: No acute or aggressive osseous abnormalities.  IMPRESSION: 1. Punctate (approximately 0.8 and 0.5 cm) stones within superior and mid aspects of the right ureter result in moderate upstream ureterectasis and pelvicaliectasis. 2. No additional evidence of nephrolithiasis. No evidence of left-sided urinary obstruction. 3. Myomatous uterus.   Electronically Signed   By: Sandi Mariscal M.D.   On: 09/17/2016 15:17   Assessment & Plan:    Patient has decided on right ureteroscopy with laser lithotripsy with ureteral stent placement for definitive treatments of her right ureteral stones.    1. Right ureteral stones  - patient with two stones in the right ureter  - we discussed MET vs ESWL vs URS/LL/stent placement, success rates and risks of each procedure   - schedule right ureteroscopy with laser lithotripsy and ureteral stent placement  - explained to the patient how the procedure is performed and the risks involved  - informed patient that they will have a stent placed during the procedure and will remain in place after the procedure for a short time.   - stent may be removed in the office with a cystoscope or patient may be instructed to remove the stent themselves by the string  - described "stent pain" as feelings of needing to urinate/overactive bladder and a warm,  tingling sensation to intense pain in the affected flank  - residual stones within the kidney or ureter may be present after the procedure and may need to have these addressed at a different encounter  - injury to the ureter is the most common intra-operative risk, it may result in an open procedure to correct the defect  - infection and bleeding are also risks  - explained the risks of general anesthesia, such as: MI, CVA, paralysis, coma and/or death.  - advised to contact our office or seek treatment in the ED if becomes febrile or pain/ vomiting are difficult control in order to arrange for emergent/urgent intervention  - Urinalysis, Complete  - CULTURE, URINE COMPREHENSIVE  - patient given tamsulosin and Percocet 10/325, # 10  2. Right hydronephrosis  - obtain RUS to ensure the hydronephrosis has resolved once stent has been removed  3. Microscopic hematuria  - UA today demonstrates 11-30 RBC's/hpf  - continue to monitor  the patient's UA after the treatment/passage of the stone to ensure the hematuria has resolved  - if hematuria persists, we will pursue a hematuria workup with CT Urogram and cystoscopy if appropriate.   Return for right URS/LL/ureteral stent placement.  These notes generated with voice recognition software. I apologize for typographical errors.  Zara Council, [facility] [address] [place],  [address] [phone]

## 2016-10-01 ENCOUNTER — Telehealth: Payer: Self-pay | Admitting: Radiology

## 2016-10-01 ENCOUNTER — Other Ambulatory Visit: Payer: Self-pay | Admitting: Radiology

## 2016-10-01 DIAGNOSIS — N201 Calculus of ureter: Secondary | ICD-10-CM

## 2016-10-01 NOTE — Telephone Encounter (Signed)
Notified pt of surgery with Dr Erlene Quan at [facility] on 10/14/16, pre-admit testing appt on 10/03/16 @8 :00 & to call day prior to surgery for arrival time to [facility]. Pt voices understanding.

## 2016-10-01 NOTE — Telephone Encounter (Signed)
LMOM. Need to discuss surgery information. 

## 2016-10-03 ENCOUNTER — Encounter
Admission: RE | Admit: 2016-10-03 | Discharge: 2016-10-03 | Disposition: A | Discharge status: Home / Self Care | Payer: 59 | Source: Ambulatory Visit | Attending: Urology | Admitting: Urology

## 2016-10-03 DIAGNOSIS — N132 Hydronephrosis with renal and ureteral calculous obstruction: Secondary | ICD-10-CM | POA: Diagnosis not present

## 2016-10-03 DIAGNOSIS — Z01812 Encounter for preprocedural laboratory examination: Secondary | ICD-10-CM | POA: Insufficient documentation

## 2016-10-03 DIAGNOSIS — R3129 Other microscopic hematuria: Secondary | ICD-10-CM | POA: Diagnosis not present

## 2016-10-03 HISTORY — DX: Gastro-esophageal reflux disease without esophagitis: K21.9

## 2016-10-03 HISTORY — DX: Panic disorder (episodic paroxysmal anxiety): F41.0

## 2016-10-03 HISTORY — DX: Anxiety disorder, unspecified: F41.9

## 2016-10-03 HISTORY — DX: Irritable bowel syndrome, unspecified: K58.9

## 2016-10-03 HISTORY — DX: Diverticulosis of intestine, part unspecified, without perforation or abscess without bleeding: K57.90

## 2016-10-03 HISTORY — DX: Personal history of urinary calculi: Z87.442

## 2016-10-03 LAB — CULTURE, URINE COMPREHENSIVE

## 2016-10-03 NOTE — Patient Instructions (Signed)
  Your procedure is scheduled [id number] 14, 2017 (Tuesday) Report to Same Day Surgery 2nd floor [facility] To find out your arrival time please call [phone] between 1PM - 3PM on October 13, 2016 (Monday)  Remember: Instructions that are not followed completely may result in serious medical risk, up to and including death, or upon the discretion of your surgeon and anesthesiologist your surgery may need to be rescheduled.    _x___ 1. Do not eat food or drink liquids after midnight. No gum chewing or hard candies.     __x__ 2. No Alcohol for 24 hours before or after surgery.   __x__3. No Smoking for 24 prior to surgery.   ____  4. Bring all medications with you on the day of surgery if instructed.    __x__ 5. Notify your doctor if there is any change in your medical condition     (cold, fever, infections).     Do not wear jewelry, make-up, hairpins, clips or nail polish.  Do not wear lotions, powders, or perfumes. You may wear deodorant.  Do not shave 48 hours prior to surgery. Men may shave face and neck.  Do not bring valuables to the hospital.    [facility] is not responsible for any belongings or valuables.               Contacts, dentures or bridgework may not be worn into surgery.  Leave your suitcase in the car. After surgery it may be brought to your room.  For patients admitted to the hospital, discharge time is determined by your treatment team.   Patients discharged the day of surgery will not be allowed to drive home.    Please read over the following fact sheets that you were given:   [facility] Preparing for Surgery and or MRSA Information   _x___ Take these medicines the morning of surgery with A SIP OF WATER:    1. Dexilant  2.  3.  4.  5.  6.  ____Fleets enema or Magnesium Citrate as directed.   ___ Use CHG Soap or sage wipes as directed on instruction sheet   ____ Use inhalers on the day of surgery and bring to hospital day of surgery  ____  Stop metformin 2 days prior to surgery    ____ Take 1/2 of usual insulin dose the night before surgery and none on the morning of           surgery.   _x___ Stop aspirin or coumadin, or plavix (NO ASPIRIN)  x__ Stop Anti-inflammatories such as Advil, Aleve, Ibuprofen, Motrin, Naproxen,          Naprosyn, Goodies powders or aspirin products. Ok to take Tylenol.   _x___ Stop supplements until after surgery.  (STOP PROBIOTIC NOW)  ____ Bring C-Pap to the hospital.

## 2016-10-14 ENCOUNTER — Encounter: Payer: Self-pay | Admitting: *Deleted

## 2016-10-14 ENCOUNTER — Encounter
Admission: RE | Disposition: A | Discharge status: Home / Self Care | Payer: Self-pay | Source: Ambulatory Visit | Attending: Urology

## 2016-10-14 ENCOUNTER — Ambulatory Visit: Payer: 59 | Admitting: Certified Registered"

## 2016-10-14 ENCOUNTER — Ambulatory Visit
Admission: RE | Admit: 2016-10-14 | Discharge: 2016-10-14 | Disposition: A | Discharge status: Home / Self Care | Payer: 59 | Source: Ambulatory Visit | Attending: Urology | Admitting: Urology

## 2016-10-14 DIAGNOSIS — N132 Hydronephrosis with renal and ureteral calculous obstruction: Secondary | ICD-10-CM | POA: Diagnosis present

## 2016-10-14 DIAGNOSIS — Z803 Family history of malignant neoplasm of breast: Secondary | ICD-10-CM | POA: Diagnosis not present

## 2016-10-14 DIAGNOSIS — K219 Gastro-esophageal reflux disease without esophagitis: Secondary | ICD-10-CM | POA: Insufficient documentation

## 2016-10-14 DIAGNOSIS — I10 Essential (primary) hypertension: Secondary | ICD-10-CM | POA: Insufficient documentation

## 2016-10-14 DIAGNOSIS — D72829 Elevated white blood cell count, unspecified: Secondary | ICD-10-CM | POA: Insufficient documentation

## 2016-10-14 DIAGNOSIS — N3281 Overactive bladder: Secondary | ICD-10-CM | POA: Insufficient documentation

## 2016-10-14 DIAGNOSIS — N201 Calculus of ureter: Secondary | ICD-10-CM | POA: Diagnosis not present

## 2016-10-14 DIAGNOSIS — Z91040 Latex allergy status: Secondary | ICD-10-CM | POA: Insufficient documentation

## 2016-10-14 DIAGNOSIS — Z6830 Body mass index (BMI) 30.0-30.9, adult: Secondary | ICD-10-CM | POA: Insufficient documentation

## 2016-10-14 DIAGNOSIS — D259 Leiomyoma of uterus, unspecified: Secondary | ICD-10-CM | POA: Diagnosis not present

## 2016-10-14 DIAGNOSIS — Z87442 Personal history of urinary calculi: Secondary | ICD-10-CM | POA: Diagnosis not present

## 2016-10-14 DIAGNOSIS — Z8042 Family history of malignant neoplasm of prostate: Secondary | ICD-10-CM | POA: Diagnosis not present

## 2016-10-14 HISTORY — PX: CYSTOSCOPY/URETEROSCOPY/HOLMIUM LASER/STENT PLACEMENT: SHX6546

## 2016-10-14 LAB — POCT PREGNANCY, URINE: PREG TEST UR: NEGATIVE

## 2016-10-14 SURGERY — CYSTOSCOPY/URETEROSCOPY/HOLMIUM LASER/STENT PLACEMENT
Anesthesia: General | Laterality: Right

## 2016-10-14 MED ORDER — FENTANYL CITRATE (PF) 100 MCG/2ML IJ SOLN
25.0000 ug | INTRAMUSCULAR | Status: DC | PRN
  Administered 2016-10-14 (×2): 25 ug via INTRAVENOUS

## 2016-10-14 MED ORDER — ONDANSETRON HCL 4 MG/2ML IJ SOLN: 4.0000 mg | Freq: Once | INTRAMUSCULAR | Status: DC | PRN

## 2016-10-14 MED ORDER — PROPOFOL 10 MG/ML IV BOLUS
INTRAVENOUS | Status: DC | PRN
  Administered 2016-10-14: 160 mg via INTRAVENOUS

## 2016-10-14 MED ORDER — ONDANSETRON HCL 4 MG/2ML IJ SOLN
INTRAMUSCULAR | Status: DC | PRN
  Administered 2016-10-14: 4 mg via INTRAVENOUS

## 2016-10-14 MED ORDER — OXYBUTYNIN CHLORIDE 5 MG PO TABS: 5.0000 mg | ORAL_TABLET | Freq: Three times a day (TID) | ORAL | 0 refills | Status: DC | PRN

## 2016-10-14 MED ORDER — LABETALOL HCL 5 MG/ML IV SOLN
5.0000 mg | Freq: Once | INTRAVENOUS | Status: AC
  Administered 2016-10-14: 5 mg via INTRAVENOUS

## 2016-10-14 MED ORDER — FAMOTIDINE 20 MG PO TABS
20.0000 mg | ORAL_TABLET | Freq: Once | ORAL | Status: AC
  Administered 2016-10-14: 20 mg via ORAL

## 2016-10-14 MED ORDER — CEFAZOLIN SODIUM-DEXTROSE 2-4 GM/100ML-% IV SOLN
2.0000 g | INTRAVENOUS | Status: AC
  Administered 2016-10-14: 2 g via INTRAVENOUS

## 2016-10-14 MED ORDER — FENTANYL CITRATE (PF) 100 MCG/2ML IJ SOLN
INTRAMUSCULAR | Status: DC | PRN
  Administered 2016-10-14: 100 ug via INTRAVENOUS

## 2016-10-14 MED ORDER — MIDAZOLAM HCL 2 MG/2ML IJ SOLN
INTRAMUSCULAR | Status: DC | PRN
  Administered 2016-10-14: 2 mg via INTRAVENOUS

## 2016-10-14 MED ORDER — CEFAZOLIN SODIUM-DEXTROSE 2-4 GM/100ML-% IV SOLN
INTRAVENOUS | Status: AC
  Filled 2016-10-14: qty 100

## 2016-10-14 MED ORDER — LABETALOL HCL 5 MG/ML IV SOLN
INTRAVENOUS | Status: AC
  Filled 2016-10-14: qty 4

## 2016-10-14 MED ORDER — DEXAMETHASONE SODIUM PHOSPHATE 10 MG/ML IJ SOLN
INTRAMUSCULAR | Status: DC | PRN
  Administered 2016-10-14: 4 mg via INTRAVENOUS

## 2016-10-14 MED ORDER — LIDOCAINE HCL (CARDIAC) 20 MG/ML IV SOLN
INTRAVENOUS | Status: DC | PRN
  Administered 2016-10-14: 100 mg via INTRAVENOUS

## 2016-10-14 MED ORDER — DOCUSATE SODIUM 100 MG PO CAPS: 100.0000 mg | ORAL_CAPSULE | Freq: Two times a day (BID) | ORAL | 0 refills | Status: DC

## 2016-10-14 MED ORDER — HYDROCODONE-ACETAMINOPHEN 5-325 MG PO TABS: 1.0000 | ORAL_TABLET | Freq: Four times a day (QID) | ORAL | 0 refills | Status: DC | PRN

## 2016-10-14 MED ORDER — FAMOTIDINE 20 MG PO TABS
ORAL_TABLET | ORAL | Status: AC
  Administered 2016-10-14: 20 mg via ORAL
  Filled 2016-10-14: qty 1

## 2016-10-14 MED ORDER — IOTHALAMATE MEGLUMINE 43 % IV SOLN
INTRAVENOUS | Status: DC | PRN
  Administered 2016-10-14: 15 mL

## 2016-10-14 MED ORDER — LACTATED RINGERS IV SOLN
INTRAVENOUS | Status: DC
  Administered 2016-10-14: 10:00:00 via INTRAVENOUS

## 2016-10-14 MED ORDER — SUGAMMADEX SODIUM 200 MG/2ML IV SOLN
INTRAVENOUS | Status: DC | PRN
  Administered 2016-10-14: 170 mg via INTRAVENOUS

## 2016-10-14 MED ORDER — ROCURONIUM BROMIDE 100 MG/10ML IV SOLN
INTRAVENOUS | Status: DC | PRN
  Administered 2016-10-14: 5 mg via INTRAVENOUS
  Administered 2016-10-14: 40 mg via INTRAVENOUS
  Administered 2016-10-14 (×2): 5 mg via INTRAVENOUS

## 2016-10-14 MED ORDER — FENTANYL CITRATE (PF) 100 MCG/2ML IJ SOLN
INTRAMUSCULAR | Status: AC
Start: 2016-10-14 — End: 2016-10-14
  Administered 2016-10-14: 25 ug via INTRAVENOUS
  Filled 2016-10-14: qty 2

## 2016-10-14 SURGICAL SUPPLY — 34 items
BAG DRAIN CYSTO-URO LG1000N (MISCELLANEOUS) ×3 IMPLANT
BASKET ZERO TIP 1.9FR (BASKET) ×2 IMPLANT
BRUSH SCRUB EZ 1% IODOPHOR (MISCELLANEOUS) ×3 IMPLANT
BSKT STON RTRVL ZERO TP 1.9FR (BASKET) ×1
CATH URETL 5X70 OPEN END (CATHETERS) ×3 IMPLANT
CNTNR SPEC 2.5X3XGRAD LEK (MISCELLANEOUS) ×1
CONRAY 43 FOR UROLOGY 50M (MISCELLANEOUS) ×3 IMPLANT
CONT SPEC 4OZ STER OR WHT (MISCELLANEOUS) ×2
CONT SPEC 4OZ STRL OR WHT (MISCELLANEOUS) ×1
CONTAINER SPEC 2.5X3XGRAD LEK (MISCELLANEOUS) IMPLANT
DRAPE UTILITY 15X26 TOWEL STRL (DRAPES) ×3 IMPLANT
FIBER LASER LITHO 273 (Laser) ×2 IMPLANT
GLIDEWIRE STIFF .35X180X3 HYDR (WIRE) ×2 IMPLANT
GLOVE BIO SURGEON STRL SZ 6.5 (GLOVE) ×2 IMPLANT
GLOVE BIO SURGEONS STRL SZ 6.5 (GLOVE) ×1
GOWN STRL REUS W/ TWL LRG LVL3 (GOWN DISPOSABLE) ×2 IMPLANT
GOWN STRL REUS W/TWL LRG LVL3 (GOWN DISPOSABLE) ×6
GUIDEWIRE GREEN .038 145CM (MISCELLANEOUS) ×2 IMPLANT
GUIDEWIRE SUPER STIFF (WIRE) IMPLANT
INFUSOR MANOMETER BAG 3000ML (MISCELLANEOUS) ×3 IMPLANT
INTRODUCER DILATOR DOUBLE (INTRODUCER) IMPLANT
KIT RM TURNOVER CYSTO AR (KITS) ×3 IMPLANT
PACK CYSTO AR (MISCELLANEOUS) ×3 IMPLANT
SENSORWIRE 0.038 NOT ANGLED (WIRE) ×6
SET CYSTO W/LG BORE CLAMP LF (SET/KITS/TRAYS/PACK) ×3 IMPLANT
SHEATH URETERAL 12FRX35CM (MISCELLANEOUS) IMPLANT
SOL .9 NS 3000ML IRR  AL (IV SOLUTION) ×2
SOL .9 NS 3000ML IRR AL (IV SOLUTION) ×1
SOL .9 NS 3000ML IRR UROMATIC (IV SOLUTION) ×1 IMPLANT
STENT URET 6FRX24 CONTOUR (STENTS) ×2 IMPLANT
STENT URET 6FRX26 CONTOUR (STENTS) IMPLANT
SURGILUBE 2OZ TUBE FLIPTOP (MISCELLANEOUS) ×3 IMPLANT
WATER STERILE IRR 1000ML POUR (IV SOLUTION) ×3 IMPLANT
WIRE SENSOR 0.038 NOT ANGLED (WIRE) ×2 IMPLANT

## 2016-10-14 NOTE — OR Nursing (Signed)
Dr Erlene Quan in to talk with pt and gave prescriptions to family.

## 2016-10-14 NOTE — Interval H&P Note (Signed)
History and Physical Interval Note:  10/14/2016 10:46 AM  Chelsea Neal  has presented today for surgery, with the diagnosis of right ureteral stones  The various methods of treatment have been discussed with the patient and family. After consideration of risks, benefits and other options for treatment, the patient has consented to  Procedure(s): CYSTOSCOPY/URETEROSCOPY/HOLMIUM LASER/STENT PLACEMENT (Right) as a surgical intervention .  The patient's history has been reviewed, patient examined, no change in status, stable for surgery.  I have reviewed the patient's chart and labs.  Questions were answered to the patient's satisfaction.    RRR CTAB  Hollice Espy

## 2016-10-14 NOTE — Transfer of Care (Signed)
Immediate Anesthesia Transfer of Care Note  Patient: Chelsea Neal  Procedure(s) Performed: Procedure(s): CYSTOSCOPY/URETEROSCOPY/HOLMIUM LASER/STENT PLACEMENT (Right)  Patient Location: PACU  Anesthesia Type:General  Level of Consciousness: sedated and responds to stimulation  Airway & Oxygen Therapy: Patient Spontanous Breathing and Patient connected to face mask oxygen  Post-op Assessment: Report given to RN and Post -op Vital signs reviewed and stable  Post vital signs: Reviewed and stable  Last Vitals:  Vitals:   10/14/16 0928 10/14/16 1252  BP: 131/89 (!) 146/91  Pulse: 63 88  Resp: 16 (!) 7  Temp: 36.7 C     Last Pain: There were no vitals filed for this visit.       Complications: No apparent anesthesia complications

## 2016-10-14 NOTE — Anesthesia Postprocedure Evaluation (Signed)
Anesthesia Post Note  Patient: Chelsea Neal  Procedure(s) Performed: Procedure(s) (LRB): CYSTOSCOPY/URETEROSCOPY/HOLMIUM LASER/STENT PLACEMENT (Right)  Patient location during evaluation: PACU Anesthesia Type: General Level of consciousness: awake Pain management: pain level controlled Vital Signs Assessment: post-procedure vital signs reviewed and stable Respiratory status: spontaneous breathing Cardiovascular status: stable Anesthetic complications: no    Last Vitals:  Vitals:   10/14/16 1308 10/14/16 1323  BP: (!) 135/95 (!) 140/98  Pulse: 80 79  Resp: 18 12  Temp:      Last Pain:  Vitals:   10/14/16 1323  PainSc: 0-No pain                 VAN STAVEREN,Xsavier Seeley

## 2016-10-14 NOTE — H&P (View-Only) (Signed)
09/30/2016 2:16 PM   Chelsea Neal July 12, 1966 [id number]  Referring provider: Maryland Pink, MD [address] [facility] [place], [place] [address]  Chief Complaint  Patient presents with  . New Patient (Initial Visit)    kidney stone    HPI: Patient is a 50 year old African American female who presents today for nephrolithiasis.  Patient states the onset of the pain was two weeks ago.   It was aching, moderate intensity and constant.  The pain was located in the RUQ.    The pain was a 8/10.  Nothing made the pain better.   Eating made the pain worse.  She did have nausea and vomiting, but she denies fevers, chills and gross hematuria.    In the ED, she received morphine and Zofran.  UA noted 0-5 RBC's/hpf.  Serum creatinine was 0.95.   Prior serum creatinine 0.90.  CT scan with contrast noted two (approximately 0.8 and 0.5 cm) stones within superior and mid aspects of the right ureter result in moderate upstream ureterectasis and pelvicaliectasis. No additional evidence of nephrolithiasis. No evidence of left-sided urinary obstruction.  Myomatous uterus.  I have independently reviewed the films.     Today, she is having gnawing RUQ.  She is not experiencing fevers, chills, nausea or vomiting.   UA today noted 11-30 WBC's and 11-30 RBC's.    She have a prior history of stones.  One stone was present on an ultrasound in 2015.       PMH: Past Medical History:  Diagnosis Date  . Fibroids   . H/O menorrhagia   . Hypertension     Surgical History: Past Surgical History:  Procedure Laterality Date  . bucal tumor removal    . hammer toe repair      Home Medications:    Medication List       Accurate as of 09/30/16  2:16 PM. Always use your most recent med list.          acidophilus Caps capsule Take by mouth daily.   calcium carbonate 1250 (500 Ca) MG tablet Commonly known as:  OS-CAL - dosed in mg of elemental calcium Take 2 tablets by mouth.   DEXILANT 60 MG capsule Generic drug:  dexlansoprazole take 1 capsule by mouth 30-60 MINUTES PRIOR TO EVENING MEAL   famotidine 20 MG tablet Commonly known as:  PEPCID Take 1 tablet (20 mg total) by mouth 2 (two) times daily.   metoCLOPramide 10 MG tablet Commonly known as:  REGLAN Take 1 tablet (10 mg total) by mouth every 6 (six) hours as needed.   multivitamin capsule Take 1 capsule by mouth daily.   oxyCODONE-acetaminophen 10-325 MG tablet Commonly known as:  PERCOCET Take 1 tablet by mouth every 4 (four) hours as needed for pain.   sucralfate 1 g tablet Commonly known as:  CARAFATE Take 1 tablet (1 g total) by mouth 4 (four) times daily.   tamsulosin 0.4 MG Caps capsule Commonly known as:  FLOMAX Take 1 capsule (0.4 mg total) by mouth daily.   venlafaxine 37.5 MG tablet Commonly known as:  EFFEXOR Take by mouth.       Allergies:  Allergies  Allergen Reactions  . Latex Hives    Family History: Family History  Problem Relation Age of Onset  . Cancer Maternal Grandmother     Breast   . Prostate cancer Father   . Kidney cancer Neg Hx   . Bladder Cancer Neg Hx  Social History:  reports that she has never smoked. She has never used smokeless tobacco. She reports that she drinks alcohol. She reports that she does not use drugs.  ROS: UROLOGY Frequent Urination?: No Hard to postpone urination?: No Burning/pain with urination?: No Get up at night to urinate?: Yes Leakage of urine?: No Urine stream starts and stops?: No Trouble starting stream?: No Do you have to strain to urinate?: No Blood in urine?: No Urinary tract infection?: No Sexually transmitted disease?: No Injury to kidneys or bladder?: No Painful intercourse?: No Weak stream?: No Currently pregnant?: No Vaginal bleeding?: No Last menstrual period?: n  Gastrointestinal Nausea?: No Vomiting?: Yes Indigestion/heartburn?: Yes Diarrhea?: No Constipation?: Yes  Constitutional Fever:  No Night sweats?: No Weight loss?: Yes Fatigue?: No  Skin Skin rash/lesions?: No Itching?: No  Eyes Blurred vision?: No Double vision?: No  Ears/Nose/Throat Sore throat?: No Sinus problems?: No  Hematologic/Lymphatic Swollen glands?: No Easy bruising?: No  Cardiovascular Leg swelling?: No Chest pain?: No  Respiratory Cough?: No Shortness of breath?: No  Endocrine Excessive thirst?: No  Musculoskeletal Back pain?: Yes Joint pain?: No  Neurological Headaches?: No Dizziness?: No  Psychologic Depression?: No Anxiety?: No  Physical Exam: BP 128/82 (BP Location: Left Arm, Patient Position: Sitting, Cuff Size: Normal)   Pulse 73   Ht 5' 6"  (1.676 m)   Wt 192 lb 12.8 oz (87.5 kg)   LMP 09/09/2016 (Approximate)   BMI 31.12 kg/m   Constitutional: Well nourished. Alert and oriented, No acute distress. HEENT: [place] AT, moist mucus membranes. Trachea midline, no masses. Cardiovascular: No clubbing, cyanosis, or edema. Respiratory: Normal respiratory effort, no increased work of breathing. GI: Abdomen is soft, non tender, non distended, no abdominal masses. Liver and spleen not palpable.  No hernias appreciated.  Stool sample for occult testing is not indicated.   GU: No CVA tenderness.  No bladder fullness or masses.   Skin: No rashes, bruises or suspicious lesions. Lymph: No cervical or inguinal adenopathy. Neurologic: Grossly intact, no focal deficits, moving all 4 extremities. Psychiatric: Normal mood and affect.  Laboratory Data: Lab Results  Component Value Date   WBC 11.4 (H) 09/17/2016   HGB 14.1 09/17/2016   HCT 41.0 09/17/2016   MCV 88.3 09/17/2016   PLT 240 09/17/2016    Lab Results  Component Value Date   CREATININE 0.95 09/17/2016    Lab Results  Component Value Date   AST 26 09/17/2016   Lab Results  Component Value Date   ALT 19 09/17/2016    Urinalysis 11-30 WBC's and 11-30 RBC's.  See EPIC.    Pertinent Imaging: CLINICAL  DATA:  Vomiting. Elevated white blood cell count. History of diverticulitis.  EXAM: CT ABDOMEN AND PELVIS WITH CONTRAST  TECHNIQUE: Multidetector CT imaging of the abdomen and pelvis was performed using the standard protocol following bolus administration of intravenous contrast.  CONTRAST:  128m ISOVUE-300 IOPAMIDOL (ISOVUE-300) INJECTION 61%  COMPARISON:  None.  FINDINGS: Lower chest: Limited visualization of the lower thorax demonstrates 2 punctate (approximately 3-5 mm) nodules within the right middle (image 3, series 4) and lower (image 8) lobes. Minimal subsegmental atelectasis within the imaged bilateral lung bases, left greater than right. No focal airspace opacities. No pleural effusion.  Borderline cardiomegaly.  No pericardial effusion.  Hepatobiliary: Normal hepatic contour. No discrete hepatic lesions. No intra extrahepatic biliary duct dilatation. No ascites. Normal appearance of the gallbladder. No radiopaque gallstones.  Pancreas: Normal appearance of the pancreas.  Spleen: Normal appearance of the  spleen.  Adrenals/Urinary Tract: There is slightly delayed enhancement and excretion of the right kidney secondary to a approximately 0.8 x 0.6 cm stone within the superior aspect of the right ureter (coronal image 43, series 5) an additional punctate (approximately 0.5 cm) stone within the mid aspect of the right ureter which result in moderate upstream ureterectasis and pelvicaliectasis.  No additional renal stones are identified. No evidence of left-sided urinary obstruction. Mild asymmetric left-sided perinephric stranding.  Normal appearance of the urinary bladder given degree distention.  Normal appearance of the bilateral adrenal glands.  Stomach/Bowel: Ingested enteric contrast extensive the level of the mid/distal small bowel. Moderate to large colonic stool burden without evidence of enteric obstruction. Normal appearance of  the terminal ileum and appendix. No pneumoperitoneum, pneumatosis or portal venous gas.  Vascular/Lymphatic: Normal caliber of the abdominal aorta. The major branch vessels of the abdominal aorta appear patent on this non CTA examination. No bulky retroperitoneal, mesenteric, pelvic or inguinal lymphadenopathy.  Reproductive: Myomatous uterus with dominant fibroid within the anterior aspect of the uterine body measuring approximately 5.4 x 4.2 cm (sagittal image 59, series 6). No discrete adnexal lesion. No free fluid in the pelvic cul-de-sac.  Other: Regional soft tissues appear normal.  Musculoskeletal: No acute or aggressive osseous abnormalities.  IMPRESSION: 1. Punctate (approximately 0.8 and 0.5 cm) stones within superior and mid aspects of the right ureter result in moderate upstream ureterectasis and pelvicaliectasis. 2. No additional evidence of nephrolithiasis. No evidence of left-sided urinary obstruction. 3. Myomatous uterus.   Electronically Signed   By: Sandi Mariscal M.D.   On: 09/17/2016 15:17   Assessment & Plan:    Patient has decided on right ureteroscopy with laser lithotripsy with ureteral stent placement for definitive treatments of her right ureteral stones.    1. Right ureteral stones  - patient with two stones in the right ureter  - we discussed MET vs ESWL vs URS/LL/stent placement, success rates and risks of each procedure   - schedule right ureteroscopy with laser lithotripsy and ureteral stent placement  - explained to the patient how the procedure is performed and the risks involved  - informed patient that they will have a stent placed during the procedure and will remain in place after the procedure for a short time.   - stent may be removed in the office with a cystoscope or patient may be instructed to remove the stent themselves by the string  - described "stent pain" as feelings of needing to urinate/overactive bladder and a warm,  tingling sensation to intense pain in the affected flank  - residual stones within the kidney or ureter may be present after the procedure and may need to have these addressed at a different encounter  - injury to the ureter is the most common intra-operative risk, it may result in an open procedure to correct the defect  - infection and bleeding are also risks  - explained the risks of general anesthesia, such as: MI, CVA, paralysis, coma and/or death.  - advised to contact our office or seek treatment in the ED if becomes febrile or pain/ vomiting are difficult control in order to arrange for emergent/urgent intervention  - Urinalysis, Complete  - CULTURE, URINE COMPREHENSIVE  - patient given tamsulosin and Percocet 10/325, # 10  2. Right hydronephrosis  - obtain RUS to ensure the hydronephrosis has resolved once stent has been removed  3. Microscopic hematuria  - UA today demonstrates 11-30 RBC's/hpf  - continue to monitor  the patient's UA after the treatment/passage of the stone to ensure the hematuria has resolved  - if hematuria persists, we will pursue a hematuria workup with CT Urogram and cystoscopy if appropriate.   Return for right URS/LL/ureteral stent placement.  These notes generated with voice recognition software. I apologize for typographical errors.  Zara Council, [facility] [address], [place], [place] [address] [phone]

## 2016-10-14 NOTE — Anesthesia Preprocedure Evaluation (Signed)
Anesthesia Evaluation  Patient identified by MRN, date of birth, ID band Patient awake    Reviewed: Allergy & Precautions, NPO status , Patient's Chart, lab work & pertinent test results  Airway Mallampati: II       Dental  (+) Teeth Intact   Pulmonary neg pulmonary ROS,    breath sounds clear to auscultation       Cardiovascular Exercise Tolerance: Good hypertension, Pt. on medications  Rhythm:Regular Rate:Normal     Neuro/Psych negative neurological ROS     GI/Hepatic Neg liver ROS, GERD  Medicated,  Endo/Other  negative endocrine ROSMorbid obesity  Renal/GU negative Renal ROS     Musculoskeletal   Abdominal (+) + obese,   Peds negative pediatric ROS (+)  Hematology negative hematology ROS (+)   Anesthesia Other Findings   Reproductive/Obstetrics                             Anesthesia Physical Anesthesia Plan  ASA: II  Anesthesia Plan: General   Post-op Pain Management:    Induction: Intravenous  Airway Management Planned: Oral ETT  Additional Equipment:   Intra-op Plan:   Post-operative Plan: Extubation in OR  Informed Consent: I have reviewed the patients History and Physical, chart, labs and discussed the procedure including the risks, benefits and alternatives for the proposed anesthesia with the patient or authorized representative who has indicated his/her understanding and acceptance.     Plan Discussed with: CRNA  Anesthesia Plan Comments:         Anesthesia Quick Evaluation

## 2016-10-14 NOTE — Discharge Instructions (Signed)

## 2016-10-14 NOTE — Op Note (Signed)
Date of procedure: 10/14/16  Preoperative diagnosis:  1. Right ureteral stones 2 2. Right flank pain 3. Right hydroureteronephrosis   Postoperative diagnosis:  1. Same as above   Procedure: 1. Right ureteroscopy 2. Laser lithotripsy 3. Basket extraction of Stone fragment 4. Right retrograde pyelogram 5. Right ureteral stent placement  Surgeon: Hollice Espy, MD  Anesthesia: General  Complications: None  Intraoperative findings: 2 stones identified within the ureter, one within the distal ureter and one near the level of the UPJ with proximal ureteral fold/tortuosity.  EBL: Minimal  Specimens: Stone fragment  Drains: 6 x 24 French double-J ureteral stent on right  Indication: Chelsea Neal is a 50 y.o. patient with multiple right-sided obstructing ureteral calculi..  After reviewing the management options for treatment, she elected to proceed with the above surgical procedure(s). We have discussed the potential benefits and risks of the procedure, side effects of the proposed treatment, the likelihood of the patient achieving the goals of the procedure, and any potential problems that might occur during the procedure or recuperation. Informed consent has been obtained.  Description of procedure:  The patient was taken to the operating room and general anesthesia was induced.  The patient was placed in the dorsal lithotomy position, prepped and draped in the usual sterile fashion, and preoperative antibiotics were administered. A preoperative time-out was performed.   A 21 French cystoscope was advanced per urethra into the bladder. Attention was turned to the right ureteral orifice which was cannulated using a sensor wire up to level of the kidney. There is some manipulation needed to bypass the stone within the proximal ureter. Each of the stones, both near the level of the UPJ and down the distal ureter could be easily seen on fluoroscopy  Once the wire was securely in place  in the upper pole, was snapped in place as a safety wire. A semirigid 4.5 French ureteroscope was advanced alongside the wire into the distal ureter with his first stone was encountered. A 273  laser fiber was used using the settings of 0.8 J and 10 Hz to fragment the stone to proximally 10 pieces. Each of the pieces were then basketed out using a 1.9 [place] nitinol basket until there was no residual stone fragments appreciated. Then attempted to advance the ureteroscope up to the level of the proximal ureter about on the was able to advance it to the mid kidney. Then attempted to advance a Super Stiff wire but due to her ureteral fold within the proximal ureter confirmed on retrograde pyelogram, this wire did not go easily. It did coil near the level just distal to the stone.  An 8 French flexible ureteroscope was then advanced over this wire up to the proximal ureter where this ureteral fold was appreciated. There true lumen of the ureter was identified through which the safety wire was seen traversing. Under direct visualization, I was eventually able to pass an angled Glidewire up to level of the kidney and advance the flexible ureteroscope over this wire into the renal pelvis. Upon doing this, I did not the stone into the renal pelvis and ultimately into a midpole calyx. The 8 mm stone was then fragmented using settings of 0.2 J and 40 Hz and later 1 J and 15 Hz to dust the remaining particles. The stone was quite hard. At this point in time, there was no stone fragments which were much larger than the tip of the laser fiber which was deemed satisfactory. Each and every  calyx was then directly visualized. The scope was backed to the proximal ureter and a retrograde pyelogram was performed. This revealed some mild hydronephrosis without extravasation or filling defects. This was uses a roadmap to ensure that each never calyx had been visualized and there is no residual fragments. The scope was then backed down  the length of the ureter inspecting along the way. There were no ureteral injuries or additional stone fragments appreciated. Finally, a 6 x 24 French double-J ureteral stent was advanced over the wire up to level of the kidney. The wire was partially drawn until full coil was noted within the renal pelvis. The wires and fully withdrawn and a full coil was noted within the bladder. The bladder was then drained. The patient was then cleaned and dried, repositioned the supine position, reversed from anesthesia, taken to the PACU in stable condition.  Plan: Patient will follow-up next week for cystoscopy, stent removal. We'll perform a renal ultrasound in proximal May 4 weeks after stent removal to assess for any residual silent hydronephrosis.  Hollice Espy, M.D.

## 2016-10-14 NOTE — Anesthesia Procedure Notes (Signed)
Procedure Name: Intubation Performed by: Pierra Skora Pre-anesthesia Checklist: Patient identified, Patient being monitored, Timeout performed, Emergency Drugs available and Suction available Patient Re-evaluated:Patient Re-evaluated prior to inductionOxygen Delivery Method: Circle system utilized Preoxygenation: Pre-oxygenation with 100% oxygen Intubation Type: IV induction Ventilation: Mask ventilation without difficulty Laryngoscope Size: Mac and 3 Grade View: Grade I Tube type: Oral Tube size: 7.0 mm Number of attempts: 1 Airway Equipment and Method: Stylet Placement Confirmation: ETT inserted through vocal cords under direct vision,  positive ETCO2 and breath sounds checked- equal and bilateral Secured at: 22 cm Tube secured with: Tape Dental Injury: Teeth and Oropharynx as per pre-operative assessment        

## 2016-10-21 ENCOUNTER — Ambulatory Visit (INDEPENDENT_AMBULATORY_CARE_PROVIDER_SITE_OTHER): Payer: 59 | Admitting: Urology

## 2016-10-21 VITALS — BP 139/91 | HR 88 | Ht 66.0 in

## 2016-10-21 DIAGNOSIS — N200 Calculus of kidney: Secondary | ICD-10-CM

## 2016-10-21 DIAGNOSIS — N201 Calculus of ureter: Secondary | ICD-10-CM

## 2016-10-21 MED ORDER — CIPROFLOXACIN HCL 500 MG PO TABS
500.0000 mg | ORAL_TABLET | Freq: Once | ORAL | Status: AC
  Administered 2016-10-21: 500 mg via ORAL

## 2016-10-21 MED ORDER — LIDOCAINE HCL 2 % EX GEL
1.0000 "application " | Freq: Once | CUTANEOUS | Status: AC
  Administered 2016-10-21: 1 via URETHRAL

## 2016-10-21 NOTE — Progress Notes (Signed)
   10/21/16  CC:  Chief Complaint  Patient presents with  . Cysto Stent Removal     HPI:  50 yo Female with multiple right ureteral stones with associated hydroureteronephrosis who underwent right ureteroscopy on 10/14/2016. The procedure was uncomplicated.   She returns to the office today for cystoscopy, stent removal. She is been tolerating the stent well. No UTI symptoms. No fevers or chills.  Blood pressure (!) 139/91, pulse 88, height 5\' 6"  (1.676 m), last menstrual period 10/03/2016. NED. A&Ox3.   No respiratory distress   Abd soft, NT, ND Normal external genitalia with patent urethral meatus  Cystoscopy/ Stent removal procedure  Patient identification was confirmed, informed consent was obtained, and patient was prepped using Betadine solution.  Lidocaine jelly was administered per urethral meatus.    Preoperative abx where received prior to procedure.    Procedure: - Flexible cystoscope introduced, without any difficulty.   - Thorough search of the bladder revealed:    normal urethral meatus  Stent seen emanating from right ureteral orifice, grasped with stent graspers, and removed in entirety.     Post-Procedure: - Patient tolerated the procedure well   Assessment/ Plan:  1. Ureteral calculus, right Status post right ureteroscopy Stent removed today without complication Warning signs reviewed Follow-up in 4 weeks with renal ultrasound prior    Hollice Espy, MD

## 2016-10-22 LAB — URINALYSIS, COMPLETE
Bilirubin, UA: NEGATIVE
GLUCOSE, UA: NEGATIVE
Ketones, UA: NEGATIVE
NITRITE UA: NEGATIVE
Specific Gravity, UA: 1.02 (ref 1.005–1.030)
Urobilinogen, Ur: 0.2 mg/dL (ref 0.2–1.0)
pH, UA: 5 (ref 5.0–7.5)

## 2016-10-22 LAB — MICROSCOPIC EXAMINATION
Bacteria, UA: NONE SEEN
WBC, UA: >30 /hpf — AB (ref 0–?)

## 2016-10-27 LAB — STONE ANALYSIS
Ca Oxalate,Monohydr.: 97 %
Ca phos cry stone ql IR: 3 %
Stone Weight KSTONE: 2.6 mg

## 2016-11-12 HISTORY — PX: UPPER GI ENDOSCOPY: SHX6162

## 2016-11-13 ENCOUNTER — Other Ambulatory Visit: Payer: Self-pay | Admitting: Gastroenterology

## 2016-11-13 DIAGNOSIS — R1011 Right upper quadrant pain: Secondary | ICD-10-CM

## 2016-11-13 DIAGNOSIS — R110 Nausea: Secondary | ICD-10-CM

## 2016-11-18 ENCOUNTER — Ambulatory Visit
Admission: RE | Admit: 2016-11-18 | Discharge: 2016-11-18 | Disposition: A | Discharge status: Home / Self Care | Payer: 59 | Source: Ambulatory Visit | Attending: Urology | Admitting: Urology

## 2016-11-18 DIAGNOSIS — N201 Calculus of ureter: Secondary | ICD-10-CM | POA: Diagnosis present

## 2016-11-18 DIAGNOSIS — Z87442 Personal history of urinary calculi: Secondary | ICD-10-CM | POA: Diagnosis not present

## 2016-11-18 DIAGNOSIS — Z09 Encounter for follow-up examination after completed treatment for conditions other than malignant neoplasm: Secondary | ICD-10-CM | POA: Diagnosis not present

## 2016-11-21 ENCOUNTER — Encounter
Admission: RE | Admit: 2016-11-21 | Discharge: 2016-11-21 | Disposition: A | Discharge status: Home / Self Care | Payer: 59 | Source: Ambulatory Visit | Attending: Gastroenterology | Admitting: Gastroenterology

## 2016-11-21 DIAGNOSIS — R1011 Right upper quadrant pain: Secondary | ICD-10-CM | POA: Insufficient documentation

## 2016-11-21 DIAGNOSIS — R110 Nausea: Secondary | ICD-10-CM | POA: Diagnosis not present

## 2016-11-21 MED ORDER — TECHNETIUM TC 99M MEBROFENIN IV KIT
5.0000 | PACK | Freq: Once | INTRAVENOUS | Status: AC | PRN
  Administered 2016-11-21: 5.22 via INTRAVENOUS

## 2016-12-04 DIAGNOSIS — K219 Gastro-esophageal reflux disease without esophagitis: Secondary | ICD-10-CM | POA: Insufficient documentation

## 2016-12-04 DIAGNOSIS — Z87442 Personal history of urinary calculi: Secondary | ICD-10-CM | POA: Insufficient documentation

## 2016-12-05 ENCOUNTER — Ambulatory Visit (INDEPENDENT_AMBULATORY_CARE_PROVIDER_SITE_OTHER): Payer: 59 | Admitting: Urology

## 2016-12-05 VITALS — Ht 66.0 in | Wt 195.5 lb

## 2016-12-05 DIAGNOSIS — N200 Calculus of kidney: Secondary | ICD-10-CM

## 2016-12-05 NOTE — Progress Notes (Signed)
12/05/2016 5:01 PM   Delmer Islam [birth year] [id number]  Referring provider: Maryland Pink, MD [address] [facility] [place], [place] [address]  Chief Complaint  Patient presents with  . Follow-up    renal Ultrasound results     HPI: 51 year old female with multiple right ureteral stones with associated hydroureteronephrosis who underwent right ureteroscopy on 10/14/2016. The procedure was uncomplicated.   She returned to the office on 11/20/2016 for cystoscopy, stent removal.  Follow-up renal ultrasound on 11/18/2016 shows no evidence of hydronephrosis or residual stones.  Overall, she's doing very well. She denies any flank pain or urinary issues. No gross hematuria.  She is a known history of kidney stones on imaging but has never passed a stone spontaneously. This is her first urologic surgery.  She attributes the stones to recent significant weight loss and change in her diet. She is adopted a high protein, low carb diet. She does try to drink 16 ounces of water 4 daily. She does eat a high oxalate containing diet. She avoids salt.    PMH: Past Medical History:  Diagnosis Date  . Anxiety   . Diverticulosis   . Fibroids   . GERD (gastroesophageal reflux disease)   . H/O menorrhagia   . History of kidney stones   . Hypertension   . IBS (irritable bowel syndrome)   . Panic attack     Surgical History: Past Surgical History:  Procedure Laterality Date  . bucal tumor removal Right   . CYSTOSCOPY/URETEROSCOPY/HOLMIUM LASER/STENT PLACEMENT Right 10/14/2016   Procedure: CYSTOSCOPY/URETEROSCOPY/HOLMIUM LASER/STENT PLACEMENT;  Surgeon: Hollice Espy, MD;  Location: [facility] ORS;  Service: Urology;  Laterality: Right;  . hammer toe repair Left    Dr. Paulla Dolly, [place] Medications:  Allergies as of 12/05/2016      Reactions   Latex Hives      Medication List       Accurate as of 12/05/16  5:01 PM. Always use your most recent med list.            CALCIUM-VITAMIN D PO Take 2 tablets by mouth daily.   DEXILANT 60 MG capsule Generic drug:  dexlansoprazole take 1 capsule by mouth 30-60 MINUTES PRIOR TO EVENING MEAL   multivitamin capsule Take 1 capsule by mouth daily.   PHILLIPS COLON HEALTH PO Take 1 capsule by mouth daily.       Allergies:  Allergies  Allergen Reactions  . Latex Hives    Family History: Family History  Problem Relation Age of Onset  . Cancer Maternal Grandmother     Breast   . Prostate cancer Father   . Kidney cancer Neg Hx   . Bladder Cancer Neg Hx     Social History:  reports that she has never smoked. She has never used smokeless tobacco. She reports that she drinks alcohol. She reports that she does not use drugs.  ROS: UROLOGY Frequent Urination?: No Hard to postpone urination?: No Burning/pain with urination?: No Get up at night to urinate?: No Leakage of urine?: No Urine stream starts and stops?: No Trouble starting stream?: No Do you have to strain to urinate?: No Blood in urine?: No Urinary tract infection?: No Sexually transmitted disease?: No Injury to kidneys or bladder?: No Painful intercourse?: No Weak stream?: No Currently pregnant?: No Vaginal bleeding?: No Last menstrual period?: no  Gastrointestinal Nausea?: No Vomiting?: No Indigestion/heartburn?: Yes Diarrhea?: No Constipation?: No  Constitutional Fever: No Night sweats?: No Weight  loss?: No Fatigue?: No  Skin Skin rash/lesions?: No Itching?: Yes  Eyes Blurred vision?: No Double vision?: No  Ears/Nose/Throat Sore throat?: No Sinus problems?: Yes  Hematologic/Lymphatic Swollen glands?: No Easy bruising?: No  Cardiovascular Leg swelling?: No Chest pain?: No  Respiratory Cough?: No Shortness of breath?: No  Endocrine Excessive thirst?: No  Musculoskeletal Back pain?: No Joint pain?: No  Neurological Headaches?: No Dizziness?: No  Psychologic Depression?:  No Anxiety?: No  Physical Exam: Ht 5\' 6"  (1.676 m)   Wt 195 lb 8 oz (88.7 kg)   LMP 11/17/2016   BMI 31.55 kg/m   Constitutional:  Alert and oriented, No acute distress. HEENT: [place] AT, moist mucus membranes.  Trachea midline, no masses. Cardiovascular: No clubbing, cyanosis, or edema. Respiratory: Normal respiratory effort, no increased work of breathing. GI: Abdomen is soft, nontender, nondistended, no abdominal masses GU: No CVA tenderness. Skin: No rashes, bruises or suspicious lesions. Neurologic: Grossly intact, no focal deficits, moving all 4 extremities. Psychiatric: Normal mood and affect.  Laboratory Data: Lab Results  Component Value Date   WBC 11.4 (H) 09/17/2016   HGB 14.1 09/17/2016   HCT 41.0 09/17/2016   MCV 88.3 09/17/2016   PLT 240 09/17/2016    Lab Results  Component Value Date   CREATININE 0.95 09/17/2016    Pertinent Imaging: CLINICAL DATA:  Right stent placement  EXAM: RENAL / URINARY TRACT ULTRASOUND COMPLETE  COMPARISON:  09/17/2016  FINDINGS: Right Kidney:  Length: 11.4 cm. Echogenicity within normal limits. No mass or hydronephrosis visualized.  Left Kidney:  Length: 10.7 cm. Echogenicity within normal limits. No mass or hydronephrosis visualized.  Bladder:  Appears normal for degree of bladder distention.  IMPRESSION: Right hydronephrosis has resolved.  No acute abnormality.   Electronically Signed   By: Marybelle Killings M.D.   On: 11/19/2016 08:18  Renal ultrasound per [facility] today.  Assessment & Plan:    1. Nephrolithiasis S/p uncomplicated ureteroscopy Stone composition reviewed today, primarily calcium oxalate We discussed general stone prevention techniques including drinking plenty water with goal of producing 2.5 L urine daily, increased citric acid intake, avoidance of high oxalate containing foods, and decreased salt intake.  Information about dietary recommendations given today.  No indication for  metabolic workup as this is her first stone episode.  - DG Abd 1 View; Future   Return in about 1 year (around 12/05/2017) for KUB (shannon).  Hollice Espy, MD  [facility] [address] [place], [place] [address] [phone]

## 2016-12-09 ENCOUNTER — Encounter: Payer: Self-pay | Admitting: *Deleted

## 2016-12-24 ENCOUNTER — Ambulatory Visit (INDEPENDENT_AMBULATORY_CARE_PROVIDER_SITE_OTHER): Payer: 59 | Admitting: General Surgery

## 2016-12-24 ENCOUNTER — Encounter: Payer: Self-pay | Admitting: General Surgery

## 2016-12-24 VITALS — BP 128/80 | HR 72 | Resp 12 | Ht 66.0 in | Wt 200.0 lb

## 2016-12-24 DIAGNOSIS — R1011 Right upper quadrant pain: Secondary | ICD-10-CM | POA: Diagnosis not present

## 2016-12-24 NOTE — Progress Notes (Signed)
Patient ID: Chelsea Neal, female   DOB: [birth year], 51 y.o.   MRN: [id number]  Chief Complaint  Patient presents with  . Other    gall bladder    HPI Chelsea Neal is a 51 y.o. female.  Here today for evaluation of her gall bladder. She was seeing Laurine Blazer [facility] for GERD. See did go to the ED in October 2017 for right upper abdominal pain with vomiting. The patient at that time was identified with an obstructing right ureteral calculus. This required endoscopic resection with stent placement.   She is states the pain is intermittent, now occurring less than once / week and of a less severe nature than last fall. . Indigestion but no nausea or vomiting. She feels spicy and greasy foods may trigger the pain. Onset may be 20-30 minutes after a meal, lasting up to 30 minutes. She had a HIDA scan on 11-21-16. She states when they gave her the Ensure mix she had a 5/10 pain, This was not similar to the discomfort she had previously experienced.  Abdominal ultrasound was 09-17-16. She is lactose intolerant and avoids those foods. 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 She has also been treated for kidney stones by Dr Erlene Quan during all of the testing. HPI  Past Medical History:  Diagnosis Date  . Anxiety   . Diverticulosis   . Fibroids   . GERD (gastroesophageal reflux disease)   . H/O menorrhagia   . History of kidney stones   . Hypertension   . IBS (irritable bowel syndrome)    constipation  . Panic attack     Past Surgical History:  Procedure Laterality Date  . bucal tumor removal Right   . CYSTOSCOPY/URETEROSCOPY/HOLMIUM LASER/STENT PLACEMENT Right 10/14/2016   Procedure: CYSTOSCOPY/URETEROSCOPY/HOLMIUM LASER/STENT PLACEMENT;  Surgeon: Hollice Espy, MD;  Location: [facility] ORS;  Service: Urology;  Laterality: Right;  . hammer toe repair Left    Dr. Paulla Dolly, [facility]  . UPPER GI ENDOSCOPY  11/12/2016   Dr Donnella Sham    Family History  Problem Relation  Age of Onset  . Cancer Maternal Grandmother     Breast   . Prostate cancer Father   . Kidney cancer Neg Hx   . Bladder Cancer Neg Hx     Social History Social History  Substance Use Topics  . Smoking status: Never Smoker  . Smokeless tobacco: Never Used  . Alcohol use Yes     Comment: social    Allergies  Allergen Reactions  . Latex Hives    Current Outpatient Prescriptions  Medication Sig Dispense Refill  . dexlansoprazole (DEXILANT) 60 MG capsule take 1 capsule by mouth     No current facility-administered medications for this visit.     Review of Systems Review of Systems  Constitutional: Negative.   Respiratory: Negative.   Cardiovascular: Negative.   Gastrointestinal: Positive for abdominal pain and vomiting.    Blood pressure 128/80, pulse 72, resp. rate 12, height 5\' 6"  (1.676 m), weight 200 lb (90.7 kg), last menstrual period 12/20/2016.  Physical Exam Physical Exam  Constitutional: She is oriented to person, place, and time. She appears well-developed and well-nourished.  HENT:  Mouth/Throat: Oropharynx is clear and moist.  Eyes: Conjunctivae are normal. No scleral icterus.  Neck: Neck supple.  Cardiovascular: Normal rate, regular rhythm and normal heart sounds.   No lower leg edema  Pulmonary/Chest: Effort normal and breath sounds normal.  Abdominal: Soft. Bowel sounds are normal. There  is no tenderness.  Lymphadenopathy:    She has no cervical adenopathy.  Neurological: She is alert and oriented to person, place, and time.  Skin: Skin is warm and dry.  Psychiatric: Her behavior is normal.    Data Reviewed Upper endoscopy of 11/12/2016 reviewed. 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 Polyps 2.  Office notes from 09/01/2016 reviewed.  Abdominal ultrasound dated 09/17/2016 showed no biliary track abnormality.  HIDA scan dated 11/21/2016 showed an ejection fraction of 68%, reported abdominal pain post ensure ingestion 5/10.    Assessment    Intermittent right upper quadrant pain, resolving frequency and intensity.  Past history ureteral obstruction requiring stone extraction.  Upper GI symptoms markedly improved on present PPI therapy.    Plan    The patient may have symptomatic biliary tract disease, but I think the likelihood of benefit from elective cholecystectomy at this time is probably 50%.      With an ejection fraction in the 60% range and a variable history of fatty food intolerance, I'm not sure that looked if cholecystectomy is warranted at this time.  We did discuss the pros and cons of surgery as well as the risks associated with elective cholecystectomy.  Laparoscopic Cholecystectomy with Intraoperative Cholangiogram. The procedure, including it's potential risks and complications (including but not limited to infection, bleeding, injury to intra-abdominal organs or bile ducts, bile leak, poor cosmetic result, sepsis and death) were discussed with the patient in detail. Non-operative options, including their inherent risks (acute calculous cholecystitis with possible choledocholithiasis or gallstone pancreatitis, with the risk of ascending cholangitis, sepsis, and death) were discussed as well. The patient further understands that if it is technically not possible, or it is unsafe to proceed laparoscopically, that I will convert to an open cholecystectomy.  The patient expressed and understanding of what we discussed and wishes to wait to proceed with laparoscopic cholecystectomy.she was encouraged to call the office directly if she becomes more symptomatic or better appreciates a direct connection between dietary intake and episodes of right upper quadrant pain.  She will call if she decides to have surgery.  This information has been scribed by Karie Fetch RN, BSN,BC.   Robert Bellow 12/25/2016, 3:24 PM

## 2016-12-24 NOTE — Patient Instructions (Addendum)
The patient is aware to call back for any questions or concerns.  Laparoscopic Cholecystectomy Laparoscopic cholecystectomy is surgery to remove the gallbladder. The gallbladder is a pear-shaped organ that lies beneath the liver on the right side of the body. The gallbladder stores bile, which is a fluid that helps the body to digest fats. Cholecystectomy is often done for inflammation of the gallbladder (cholecystitis). This condition is usually caused by a buildup of gallstones (cholelithiasis) in the gallbladder. Gallstones can block the flow of bile, which can result in inflammation and pain. In severe cases, emergency surgery may be required. This procedure is done though small incisions in your abdomen (laparoscopic surgery). A thin scope with a camera (laparoscope) is inserted through one incision. Thin surgical instruments are inserted through the other incisions. In some cases, a laparoscopic procedure may be turned into a type of surgery that is done through a larger incision (open surgery). Tell a health care provider about:  Any allergies you have.  All medicines you are taking, including vitamins, herbs, eye drops, creams, and over-the-counter medicines.  Any problems you or family members have had with anesthetic medicines.  Any blood disorders you have.  Any surgeries you have had.  Any medical conditions you have.  Whether you are pregnant or may be pregnant. What are the risks? Generally, this is a safe procedure. However, problems may occur, including:  Infection.  Bleeding.  Allergic reactions to medicines.  Damage to other structures or organs.  A stone remaining in the common bile duct. The common bile duct carries bile from the gallbladder into the small intestine.  A bile leak from the cyst duct that is clipped when your gallbladder is removed. What happens before the procedure? Staying hydrated  Follow instructions from your health care provider about  hydration, which may include:  Up to 2 hours before the procedure - you may continue to drink clear liquids, such as water, clear fruit juice, black coffee, and plain tea. Eating and drinking restrictions  Follow instructions from your health care provider about eating and drinking, which may include:  8 hours before the procedure - stop eating heavy meals or foods such as meat, fried foods, or fatty foods.  6 hours before the procedure - stop eating light meals or foods, such as toast or cereal.  6 hours before the procedure - stop drinking milk or drinks that contain milk.  2 hours before the procedure - stop drinking clear liquids. Medicines   Ask your health care provider about:  Changing or stopping your regular medicines. This is especially important if you are taking diabetes medicines or blood thinners.  Taking medicines such as aspirin and ibuprofen. These medicines can thin your blood. Do not take these medicines before your procedure if your health care provider instructs you not to.  You may be given antibiotic medicine to help prevent infection. General instructions   Let your health care provider know if you develop a cold or an infection before surgery.  Plan to have someone take you home from the hospital or clinic.  Ask your health care provider how your surgical site will be marked or identified. What happens during the procedure?  To reduce your risk of infection:  Your health care team will wash or sanitize their hands.  Your skin will be washed with soap.  Hair may be removed from the surgical area.  An IV tube may be inserted into one of your veins.  You will be given   one or more of the following:  A medicine to help you relax (sedative).  A medicine to make you fall asleep (general anesthetic).  A breathing tube will be placed in your mouth.  Your surgeon will make several small cuts (incisions) in your abdomen.  The laparoscope will be  inserted through one of the small incisions. The camera on the laparoscope will send images to a TV screen (monitor) in the operating room. This lets your surgeon see inside your abdomen.  Air-like gas will be pumped into your abdomen. This will expand your abdomen to give the surgeon more room to perform the surgery.  Other tools that are needed for the procedure will be inserted through the other incisions. The gallbladder will be removed through one of the incisions.  Your common bile duct may be examined. If stones are found in the common bile duct, they may be removed.  After your gallbladder has been removed, the incisions will be closed with stitches (sutures), staples, or skin glue.  Your incisions may be covered with a bandage (dressing). The procedure may vary among health care providers and hospitals. What happens after the procedure?  Your blood pressure, heart rate, breathing rate, and blood oxygen level will be monitored until the medicines you were given have worn off.  You will be given medicines as needed to control your pain.  Do not drive for 24 hours if you were given a sedative. This information is not intended to replace advice given to you by your health care provider. Make sure you discuss any questions you have with your health care provider. Document Released: 11/17/2005 Document Revised: 06/08/2016 Document Reviewed: 05/05/2016 Elsevier Interactive Patient Education  2017 Elsevier Inc.   

## 2016-12-25 DIAGNOSIS — R1011 Right upper quadrant pain: Secondary | ICD-10-CM | POA: Insufficient documentation

## 2017-09-18 ENCOUNTER — Other Ambulatory Visit: Payer: Self-pay | Admitting: Obstetrics and Gynecology

## 2017-09-18 DIAGNOSIS — Z1231 Encounter for screening mammogram for malignant neoplasm of breast: Secondary | ICD-10-CM

## 2017-09-21 ENCOUNTER — Ambulatory Visit
Admission: RE | Admit: 2017-09-21 | Discharge: 2017-09-21 | Disposition: A | Discharge status: Home / Self Care | Payer: 59 | Source: Ambulatory Visit | Attending: Obstetrics and Gynecology | Admitting: Obstetrics and Gynecology

## 2017-09-21 DIAGNOSIS — Z1231 Encounter for screening mammogram for malignant neoplasm of breast: Secondary | ICD-10-CM

## 2017-12-02 NOTE — Progress Notes (Signed)
12/03/2017 4:40 PM   Chelsea Neal 30-Nov-1966 [id number]  Referring provider: Maryland Pink, MD [address] [facility] [place],  [address]  No chief complaint on file.   HPI: 52 yo AAF with a history of nephrolithiasis who presents today for a one year follow up.  Background history 52 year old female with multiple right ureteral stones with associated hydroureteronephrosis who underwent right ureteroscopy on 10/14/2016. The procedure was uncomplicated.   She returned to the office on 11/20/2016 for cystoscopy, stent removal.  Follow-up renal ultrasound on 11/18/2016 shows no evidence of hydronephrosis or residual stones.  Overall, she's doing very well. She denies any flank pain or urinary issues. No gross hematuria.  She is a known history of kidney stones on imaging but has never passed a stone spontaneously. This is her first urologic surgery.  She attributes the stones to recent significant weight loss and change in her diet. She is adopted a high protein, low carb diet. She does try to drink 16 ounces of water 4 daily. She does eat a high oxalate containing diet. She avoids salt.    Today, she is not experiencing flank pain, gross hematuria or had any passage of fragments.  Her only complaint today is nocturia x 1.  She has not had fevers, chills, nausea or vomiting.  She has not yet had her KUB.    PMH: Past Medical History:  Diagnosis Date  . Anxiety   . Diverticulosis   . Fibroids   . GERD (gastroesophageal reflux disease)   . H/O menorrhagia   . History of kidney stones   . Hypertension   . IBS (irritable bowel syndrome)    constipation  . Panic attack     Surgical History: Past Surgical History:  Procedure Laterality Date  . BREAST CYST ASPIRATION Left   . bucal tumor removal Right   . CYSTOSCOPY/URETEROSCOPY/HOLMIUM LASER/STENT PLACEMENT Right 10/14/2016   Procedure: CYSTOSCOPY/URETEROSCOPY/HOLMIUM LASER/STENT PLACEMENT;  Surgeon: Hollice Espy, MD;  Location: [facility] ORS;  Service: Urology;  Laterality: Right;  . hammer toe repair Left    Dr. Paulla Dolly, [facility]  . UPPER GI ENDOSCOPY  11/12/2016   Dr Donnella Sham    Home Medications:  Allergies as of 12/03/2017      Reactions   Latex Hives      Medication List        Accurate as of 12/03/17  4:40 PM. Always use your most recent med list.          Biotin 1000 MCG tablet Take by mouth.   cetirizine 10 MG tablet Commonly known as:  ZYRTEC Take by mouth.   DEXILANT 60 MG capsule Generic drug:  dexlansoprazole take 1 capsule by mouth       Allergies:  Allergies  Allergen Reactions  . Latex Hives    Family History: Family History  Problem Relation Age of Onset  . Prostate cancer Father   . Breast cancer Paternal Grandmother   . Kidney cancer Neg Hx   . Bladder Cancer Neg Hx     Social History:  reports that  has never smoked. she has never used smokeless tobacco. She reports that she drinks alcohol. She reports that she does not use drugs.  ROS: UROLOGY Frequent Urination?: No Hard to postpone urination?: No Burning/pain with urination?: No Get up at night to urinate?: Yes Leakage of urine?: No Urine stream starts and stops?: No Trouble starting stream?: No Do you have to strain to urinate?: No  Blood in urine?: No Urinary tract infection?: No Sexually transmitted disease?: No Injury to kidneys or bladder?: No Painful intercourse?: No Weak stream?: No Currently pregnant?: No Vaginal bleeding?: No Last menstrual period?: n  Gastrointestinal Nausea?: No Vomiting?: No Indigestion/heartburn?: No Diarrhea?: No Constipation?: No  Constitutional Fever: No Night sweats?: No Weight loss?: No Fatigue?: No  Skin Skin rash/lesions?: No Itching?: No  Eyes Blurred vision?: No Double vision?: No  Ears/Nose/Throat Sore throat?: No Sinus problems?: Yes  Hematologic/Lymphatic Swollen glands?: No Easy bruising?:  No  Cardiovascular Leg swelling?: No Chest pain?: No  Respiratory Cough?: No Shortness of breath?: No  Endocrine Excessive thirst?: No  Musculoskeletal Back pain?: Yes Joint pain?: No  Neurological Headaches?: No Dizziness?: No  Psychologic Depression?: No Anxiety?: No  Physical Exam: BP 140/90   Pulse 94   Ht 5\' 6"  (1.676 m)   Wt 215 lb (97.5 kg)   BMI 34.70 kg/m   Constitutional: Well nourished. Alert and oriented, No acute distress. HEENT: [place] AT, moist mucus membranes. Trachea midline, no masses. Cardiovascular: No clubbing, cyanosis, or edema. Respiratory: Normal respiratory effort, no increased work of breathing. Skin: No rashes, bruises or suspicious lesions. Lymph: No cervical or inguinal adenopathy. Neurologic: Grossly intact, no focal deficits, moving all 4 extremities. Psychiatric: Normal mood and affect.  Laboratory Data: No recent labs    Assessment & Plan:    1. History of nephrolithiasis  - S/p uncomplicated ureteroscopy  - Stone composition reviewed today, primarily calcium oxalate  - We reviewed general stone prevention techniques including drinking plenty water with goal of producing 2.5 L urine daily, increased citric acid intake, avoidance of high oxalate containing foods, and decreased salt intake.   - No indication for metabolic workup as this is her first stone episode but patient is interested and may pursue metabolic work up once she has moved and settled into her new home  - patient to get a KUB sometime this week  - RTC in one year if KUB is negative   Return in about 1 year (around 12/03/2018) for KUB.  Zara Council, [facility] [address], [place], [place] [address] [phone]

## 2017-12-03 ENCOUNTER — Ambulatory Visit (INDEPENDENT_AMBULATORY_CARE_PROVIDER_SITE_OTHER): Payer: Managed Care, Other (non HMO) | Admitting: Urology

## 2017-12-03 ENCOUNTER — Encounter: Payer: Self-pay | Admitting: Urology

## 2017-12-03 VITALS — BP 140/90 | HR 94 | Ht 66.0 in | Wt 215.0 lb

## 2017-12-03 DIAGNOSIS — Z87442 Personal history of urinary calculi: Secondary | ICD-10-CM | POA: Diagnosis not present

## 2017-12-11 ENCOUNTER — Telehealth: Payer: Self-pay | Admitting: Urology

## 2017-12-11 NOTE — Telephone Encounter (Signed)
Would you remind Mrs. Comacho she still needs a KUB?

## 2017-12-11 NOTE — Telephone Encounter (Signed)
Patient notified , she will go for KUB on monday

## 2017-12-14 ENCOUNTER — Ambulatory Visit
Admission: RE | Admit: 2017-12-14 | Discharge: 2017-12-14 | Disposition: A | Discharge status: Home / Self Care | Payer: Managed Care, Other (non HMO) | Source: Ambulatory Visit | Attending: Urology | Admitting: Urology

## 2017-12-14 DIAGNOSIS — N200 Calculus of kidney: Secondary | ICD-10-CM | POA: Insufficient documentation

## 2017-12-30 ENCOUNTER — Telehealth: Payer: Self-pay

## 2017-12-30 NOTE — Telephone Encounter (Signed)
Letter sent.

## 2017-12-30 NOTE — Telephone Encounter (Signed)
-----   Message from Hollice Espy, MD sent at 12/29/2017  5:08 PM EST ----- No kidney stones.  Awesome news.    Hollice Espy, MD

## 2018-01-07 IMAGING — CT CT ABD-PELV W/ CM
2 of 5 series · 15 of 46 positions shown, 17 images · IV contrast (APPLIED)
Comparison: None.

CLINICAL DATA: Vomiting. Elevated white blood cell count. History
of diverticulitis.

EXAM:
CT ABDOMEN AND PELVIS WITH CONTRAST
TECHNIQUE: Multidetector CT imaging of the abdomen and pelvis was performed
using the standard protocol following bolus administration of
intravenous contrast.
CONTRAST:  100mL 1OVQRA-PAA IOPAMIDOL (1OVQRA-PAA) INJECTION 61%

[Series 2: axial st · axial · 0.75mm/px · z∈[-7,+418]mm · 12 of 96 slices shown, 14 images]
[im 6/96  soft-tissue]
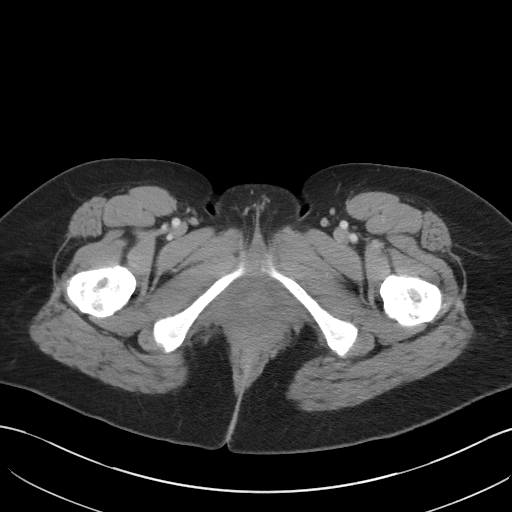
[im 6/96  bone]
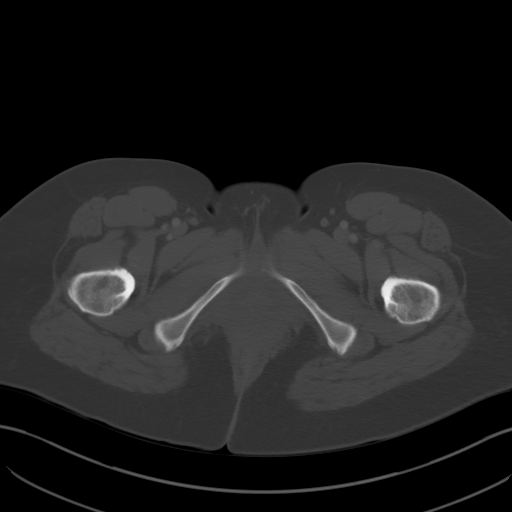
[im 16/96  soft-tissue]
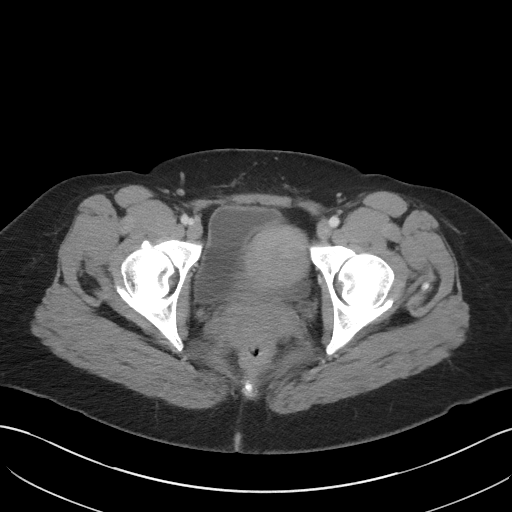
[im 21/96  soft-tissue]
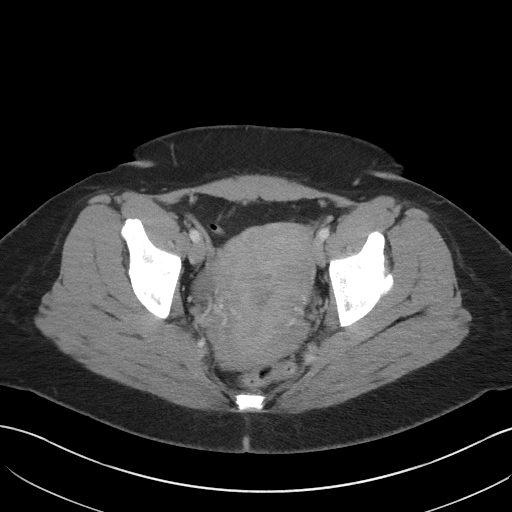
[im 31/96  soft-tissue]
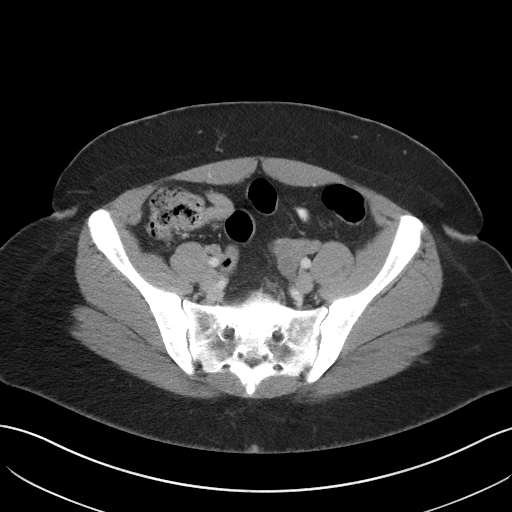
[im 36/96  soft-tissue]
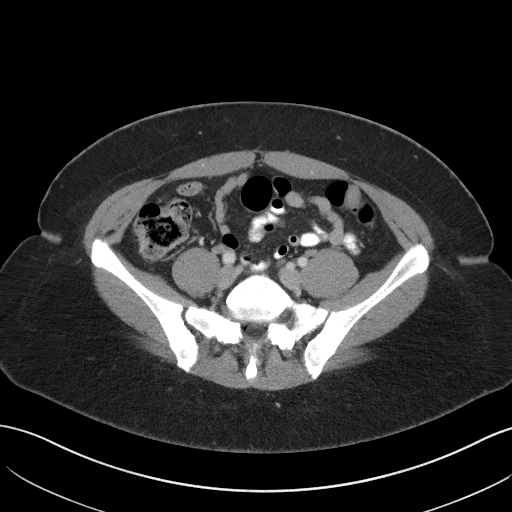
[im 46/96  soft-tissue]
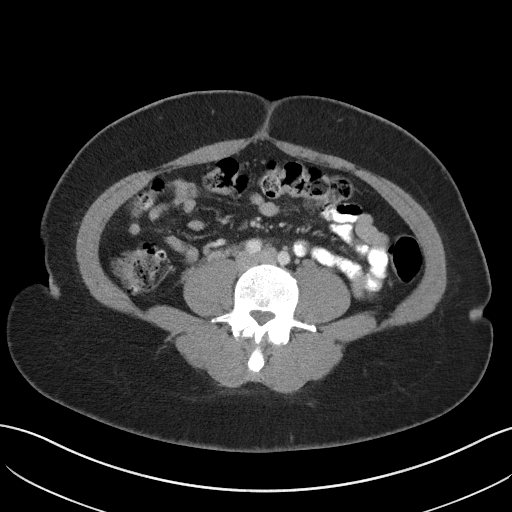
[im 51/96  soft-tissue]
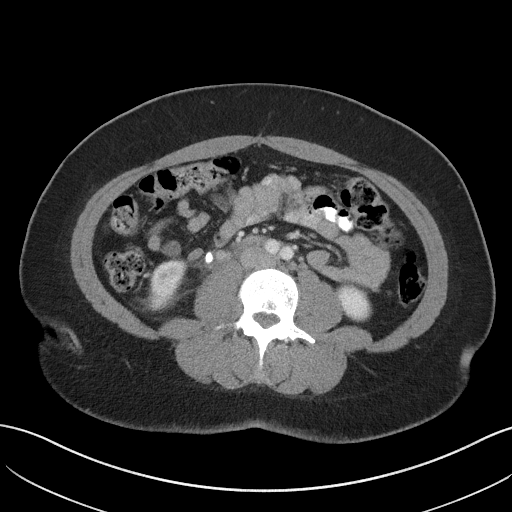
[im 61/96  soft-tissue]
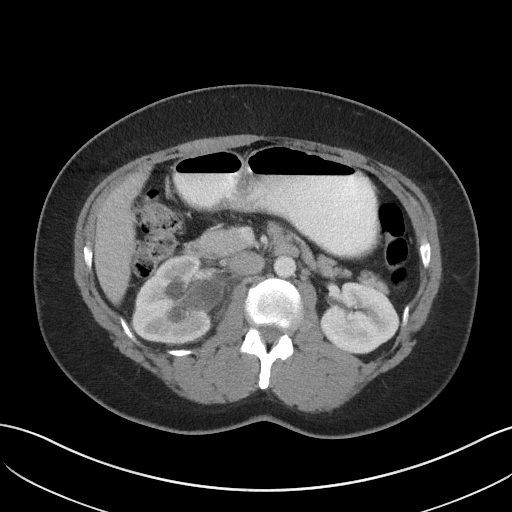
[im 66/96  soft-tissue]
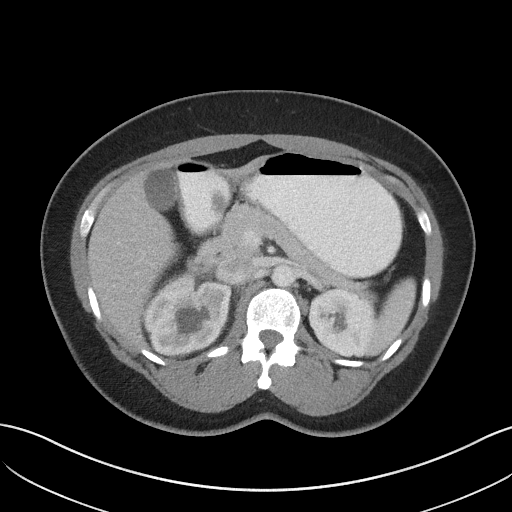
[im 66/96  bone]
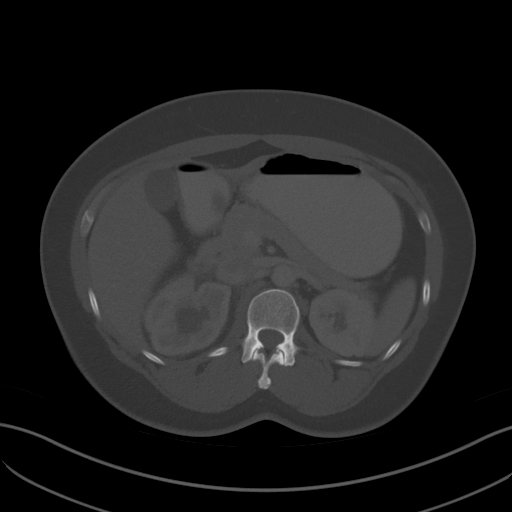
[im 76/96  soft-tissue]
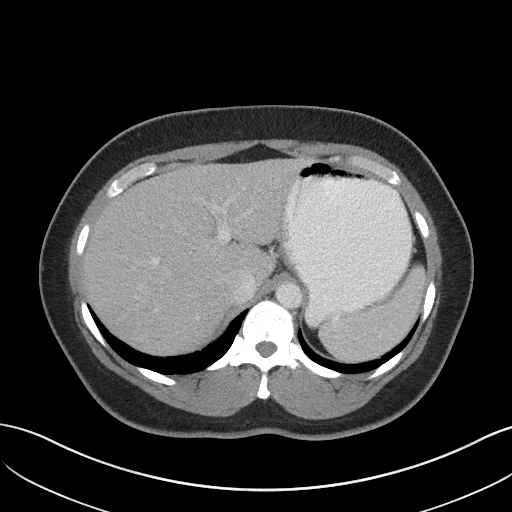
[im 81/96  soft-tissue]
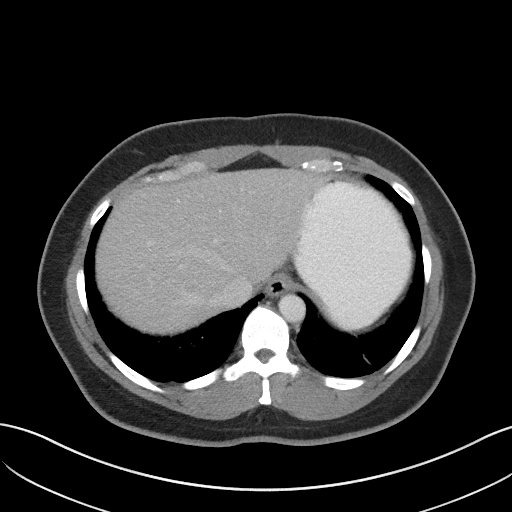
[im 91/96  soft-tissue]
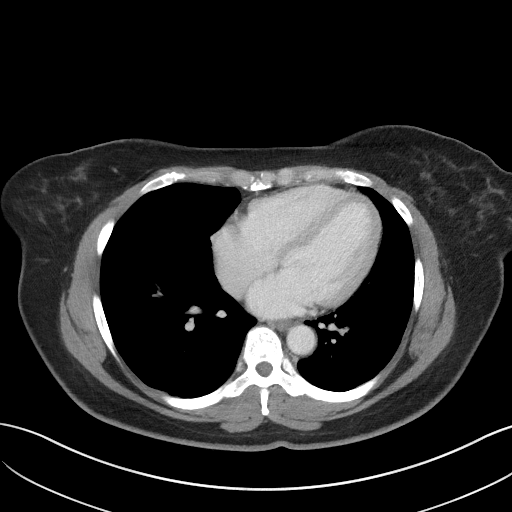

[Series 5: coronal st · coronal · 0.68mm/px · 3 of 83 slices shown]
[im 28/83  soft-tissue]
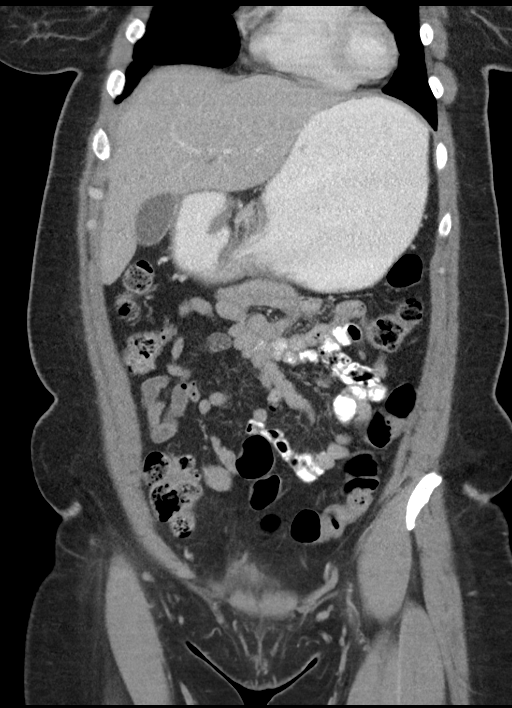
[im 37/83  soft-tissue]
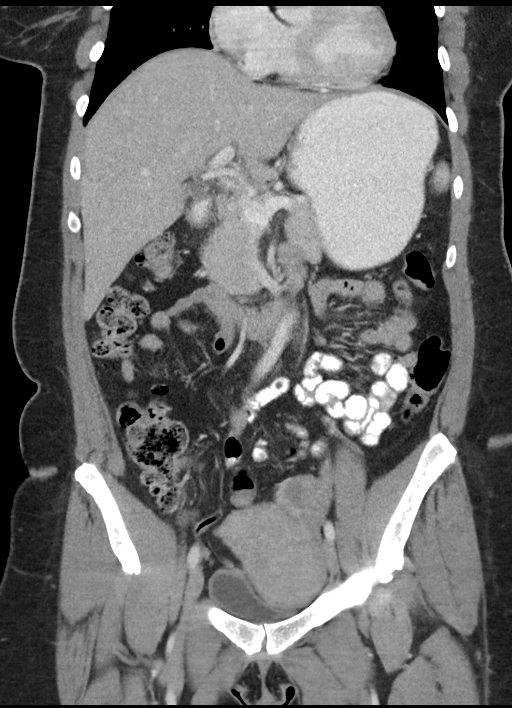
[im 46/83  soft-tissue]
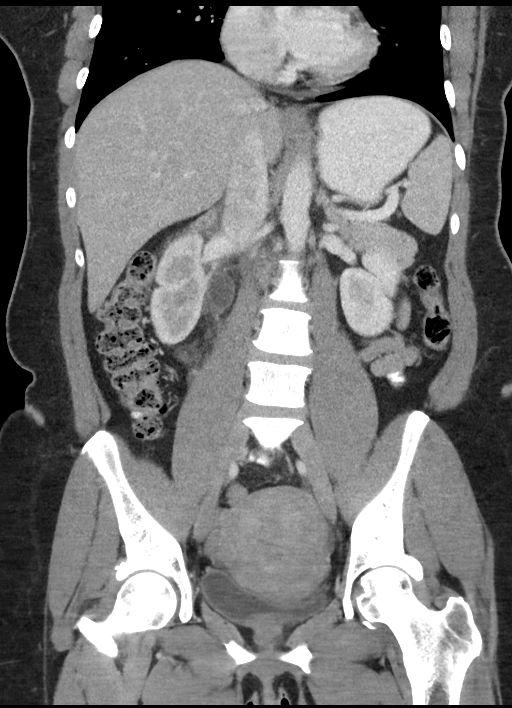

[15 of 46 positions shown; findings below may reference images not displayed]

FINDINGS: Lower chest: Limited visualization of the lower thorax demonstrates
2 punctate (approximately 3-5 mm) nodules within the right middle
(image 3, series 4) and lower (image 8) lobes. Minimal subsegmental
atelectasis within the imaged bilateral lung bases, left greater
than right. No focal airspace opacities. No pleural effusion.

Borderline cardiomegaly.  No pericardial effusion.

Hepatobiliary: Normal hepatic contour. No discrete hepatic lesions.
No intra extrahepatic biliary duct dilatation. No ascites. Normal
appearance of the gallbladder. No radiopaque gallstones.

Pancreas: Normal appearance of the pancreas.

Spleen: Normal appearance of the spleen.

Adrenals/Urinary Tract: There is slightly delayed enhancement and
excretion of the right kidney secondary to a approximately 0.8 x
cm stone within the superior aspect of the right ureter (coronal
image 43, series 5) an additional punctate (approximately 0.5 cm)
stone within the mid aspect of the right ureter which result in
moderate upstream ureterectasis and pelvicaliectasis.

No additional renal stones are identified. No evidence of left-sided
urinary obstruction. Mild asymmetric left-sided perinephric
stranding.

Normal appearance of the urinary bladder given degree distention.

Normal appearance of the bilateral adrenal glands.

Stomach/Bowel: Ingested enteric contrast extensive the level of the
mid/distal small bowel. Moderate to large colonic stool burden
without evidence of enteric obstruction. Normal appearance of the
terminal ileum and appendix. No pneumoperitoneum, pneumatosis or
portal venous gas.

Vascular/Lymphatic: Normal caliber of the abdominal aorta. The major
branch vessels of the abdominal aorta appear patent on this non CTA
examination. No bulky retroperitoneal, mesenteric, pelvic or
inguinal lymphadenopathy.

Reproductive: Myomatous uterus with dominant fibroid within the
anterior aspect of the uterine body measuring approximately 5.4 x
4.2 cm (sagittal image 59, series 6). No discrete adnexal lesion. No
free fluid in the pelvic cul-de-sac.

Other: Regional soft tissues appear normal.

Musculoskeletal: No acute or aggressive osseous abnormalities.
IMPRESSION: 1. Punctate (approximately 0.8 and 0.5 cm) stones within superior
and mid aspects of the right ureter result in moderate upstream
ureterectasis and pelvicaliectasis.
2. No additional evidence of nephrolithiasis. No evidence of
left-sided urinary obstruction.
3. Myomatous uterus.

## 2018-08-26 ENCOUNTER — Other Ambulatory Visit: Payer: Self-pay | Admitting: Obstetrics and Gynecology

## 2018-08-26 DIAGNOSIS — Z1231 Encounter for screening mammogram for malignant neoplasm of breast: Secondary | ICD-10-CM

## 2018-09-23 ENCOUNTER — Ambulatory Visit
Admission: RE | Admit: 2018-09-23 | Discharge: 2018-09-23 | Disposition: A | Discharge status: Home / Self Care | Payer: Managed Care, Other (non HMO) | Source: Ambulatory Visit | Attending: Obstetrics and Gynecology | Admitting: Obstetrics and Gynecology

## 2018-09-23 DIAGNOSIS — Z1231 Encounter for screening mammogram for malignant neoplasm of breast: Secondary | ICD-10-CM

## 2019-10-17 ENCOUNTER — Other Ambulatory Visit: Payer: Self-pay | Admitting: Obstetrics & Gynecology

## 2019-11-11 NOTE — Progress Notes (Signed)
[id number] - [place], [place] AT [address] [place] [address] Phone: [phone] Fax: [phone]      Your procedure is scheduled on Wednesday, Dec. 16th.  Report to [facility] Main Entrance "A" at 10:45 A.M., and check in at the Admitting office.  Call this number if you have problems the morning of surgery:  [phone]  Call [phone] if you have any questions prior to your surgery date Monday-Friday 8am-4pm    Remember:  Do not eat or drink after midnight the night before your surgery     Take these medicines the morning of surgery with A SIP OF WATER   Tylenol - if needed  Dexilant   7 days prior to surgery STOP taking any Aspirin (unless otherwise instructed by your surgeon), Aleve, Naproxen, Ibuprofen, Motrin, Advil, Goody's, BC's, all herbal medications, fish oil, and all vitamins.    The Morning of Surgery  Do not wear jewelry, make-up or nail polish.  Do not wear lotions, powders, or perfumes, or deodorant  Do not shave 48 hours prior to surgery.    Do not bring valuables to the hospital.  [facility] is not responsible for any belongings or valuables.  If you are a smoker, DO NOT Smoke 24 hours prior to surgery  If you wear a CPAP at night please bring your mask, tubing, and machine the morning of surgery   Remember that you must have someone to transport you home after your surgery, and remain with you for 24 hours if you are discharged the same day.   Please bring cases for contacts, glasses, hearing aids, dentures or bridgework because it cannot be worn into surgery.    Leave your suitcase in the car.  After surgery it may be brought to your room.  For patients admitted to the hospital, discharge time will be determined by your treatment team.  Patients discharged the day of surgery will not be allowed to drive home.    Special instructions:    [place]- Preparing For Surgery  Before surgery, you can play an important role. Because skin is not sterile, your skin needs to be as free of germs as possible. You can reduce the number of germs on your skin by washing with CHG (chlorahexidine gluconate) Soap before surgery.  CHG is an antiseptic cleaner which kills germs and bonds with the skin to continue killing germs even after washing.    Oral Hygiene is also important to reduce your risk of infection.  Remember - BRUSH YOUR TEETH THE MORNING OF SURGERY WITH YOUR REGULAR TOOTHPASTE  Please do not use if you have an allergy to CHG or antibacterial soaps. If your skin becomes reddened/irritated stop using the CHG.  Do not shave (including legs and underarms) for at least 48 hours prior to first CHG shower. It is OK to shave your face.  Please follow these instructions carefully.   1. Shower the NIGHT BEFORE SURGERY and the MORNING OF SURGERY with CHG Soap.   2. If you chose to wash your hair, wash your hair first as usual with your normal shampoo.  3. After you shampoo, rinse your hair and body thoroughly to remove the shampoo.  4. Use CHG as you would any other liquid soap. You can apply CHG directly to the skin and wash gently with a scrungie or a clean washcloth.   5. Apply the CHG Soap  to your body ONLY FROM THE NECK DOWN.  Do not use on open wounds or open sores. Avoid contact with your eyes, ears, mouth and genitals (private parts). Wash Face and genitals (private parts)  with your normal soap.   6. Wash thoroughly, paying special attention to the area where your surgery will be performed.  7. Thoroughly rinse your body with warm water from the neck down.  8. DO NOT shower/wash with your normal soap after using and rinsing off the CHG Soap.  9. Pat yourself dry with a CLEAN TOWEL.  10. Wear CLEAN PAJAMAS to bed the night before surgery, wear comfortable clothes the morning of surgery  11. Place CLEAN SHEETS on your bed  the night of your first shower and DO NOT SLEEP WITH PETS.    Day of Surgery:  Please shower the morning of surgery with the CHG soap Do not apply any deodorants/lotions. Please wear clean clothes to the hospital/surgery center.   Remember to brush your teeth WITH YOUR REGULAR TOOTHPASTE.   Please read over the following fact sheets that you were given.

## 2019-11-14 ENCOUNTER — Other Ambulatory Visit (HOSPITAL_COMMUNITY)
Admission: RE | Admit: 2019-11-14 | Discharge: 2019-11-14 | Disposition: A | Discharge status: Home / Self Care | Payer: Managed Care, Other (non HMO) | Source: Ambulatory Visit | Attending: Obstetrics & Gynecology | Admitting: Obstetrics & Gynecology

## 2019-11-14 ENCOUNTER — Encounter (HOSPITAL_COMMUNITY)
Admission: RE | Admit: 2019-11-14 | Discharge: 2019-11-14 | Disposition: A | Discharge status: Home / Self Care | Payer: Managed Care, Other (non HMO) | Source: Ambulatory Visit | Attending: Obstetrics & Gynecology | Admitting: Obstetrics & Gynecology

## 2019-11-14 ENCOUNTER — Encounter (HOSPITAL_COMMUNITY): Payer: Self-pay

## 2019-11-14 ENCOUNTER — Other Ambulatory Visit: Payer: Self-pay

## 2019-11-14 DIAGNOSIS — R102 Pelvic and perineal pain: Secondary | ICD-10-CM | POA: Insufficient documentation

## 2019-11-14 DIAGNOSIS — N870 Mild cervical dysplasia: Secondary | ICD-10-CM | POA: Insufficient documentation

## 2019-11-14 DIAGNOSIS — D259 Leiomyoma of uterus, unspecified: Secondary | ICD-10-CM | POA: Insufficient documentation

## 2019-11-14 DIAGNOSIS — Z01818 Encounter for other preprocedural examination: Secondary | ICD-10-CM | POA: Insufficient documentation

## 2019-11-14 DIAGNOSIS — N920 Excessive and frequent menstruation with regular cycle: Secondary | ICD-10-CM | POA: Insufficient documentation

## 2019-11-14 LAB — BASIC METABOLIC PANEL
Anion gap: 8 (ref 5–15)
BUN: 10 mg/dL (ref 6–20)
CO2: 25 mmol/L (ref 22–32)
Calcium: 9.2 mg/dL (ref 8.9–10.3)
Chloride: 106 mmol/L (ref 98–111)
Creatinine, Ser: 0.88 mg/dL (ref 0.44–1.00)
GFR calc Af Amer: >60 mL/min (ref >60)
GFR calc non Af Amer: >60 mL/min (ref >60)
Glucose, Bld: 97 mg/dL (ref 70–99)
Potassium: 4.6 mmol/L (ref 3.5–5.1)
Sodium: 139 mmol/L (ref 135–145)

## 2019-11-14 LAB — CBC
HCT: 41.3 % (ref 36.0–46.0)
Hemoglobin: 13.4 g/dL (ref 12.0–15.0)
MCH: 27.5 pg (ref 26.0–34.0)
MCHC: 32.4 g/dL (ref 30.0–36.0)
MCV: 84.6 fL (ref 80.0–100.0)
Platelets: 218 10*3/uL (ref 150–400)
RBC: 4.88 MIL/uL (ref 3.87–5.11)
RDW: 18.3 % — ABNORMAL HIGH (ref 11.5–15.5)
WBC: 3.9 10*3/uL — ABNORMAL LOW (ref 4.0–10.5)
nRBC: 0 % (ref 0.0–0.2)

## 2019-11-14 LAB — SARS CORONAVIRUS 2 (TAT 6-24 HRS): SARS Coronavirus 2: NEGATIVE

## 2019-11-14 NOTE — Progress Notes (Addendum)
PCP - Maryland Pink, MD Cardiologist - pt denies  Chest x-ray - pt denies past year EKG - 11/14/2019 at PAT appt  PPM/ICD- n/a DEVICE- n/a REP Notified- n/a  Stress Test - n/a ECHO - n/a  Cardiac Cath - n/a  Sleep Study - n/a CPAP -n/a  Fasting Blood Sugar - n/a Checks Blood Sugar _____ times a day-n/a  Blood Thinner Instructions: n/a Aspirin Instructions: n/a  ERAS Protocol- n/a Ensure pre-surgery drink or water given- n/a  COVID testing- 11/14/2019 0945  Anesthesia review: no  Patient denies shortness of breath, fever, cough and chest pain at PAT appointment  Patient verbalized understanding of instructions that were given to them at the PAT appointment. Patient was also instructed that they will need to review over the PAT instructions again at home before surgery.

## 2019-11-16 ENCOUNTER — Encounter (HOSPITAL_COMMUNITY)
Admission: RE | Disposition: A | Discharge status: Home / Self Care | Payer: Self-pay | Source: Home / Self Care | Attending: Obstetrics & Gynecology

## 2019-11-16 ENCOUNTER — Encounter (HOSPITAL_COMMUNITY): Payer: Self-pay | Admitting: Obstetrics & Gynecology

## 2019-11-16 ENCOUNTER — Other Ambulatory Visit: Payer: Self-pay

## 2019-11-16 ENCOUNTER — Inpatient Hospital Stay (HOSPITAL_COMMUNITY)
Admission: RE | Admit: 2019-11-16 | Discharge: 2019-11-18 | DRG: 743 | Disposition: A | Discharge status: Home / Self Care | Payer: Managed Care, Other (non HMO) | Attending: Obstetrics & Gynecology | Admitting: Obstetrics & Gynecology

## 2019-11-16 ENCOUNTER — Inpatient Hospital Stay (HOSPITAL_COMMUNITY): Payer: Managed Care, Other (non HMO) | Admitting: Certified Registered Nurse Anesthetist

## 2019-11-16 DIAGNOSIS — F419 Anxiety disorder, unspecified: Secondary | ICD-10-CM | POA: Insufficient documentation

## 2019-11-16 DIAGNOSIS — D251 Intramural leiomyoma of uterus: Secondary | ICD-10-CM

## 2019-11-16 DIAGNOSIS — D252 Subserosal leiomyoma of uterus: Secondary | ICD-10-CM

## 2019-11-16 DIAGNOSIS — Z803 Family history of malignant neoplasm of breast: Secondary | ICD-10-CM

## 2019-11-16 DIAGNOSIS — F410 Panic disorder [episodic paroxysmal anxiety] without agoraphobia: Secondary | ICD-10-CM | POA: Diagnosis present

## 2019-11-16 DIAGNOSIS — Z87442 Personal history of urinary calculi: Secondary | ICD-10-CM

## 2019-11-16 DIAGNOSIS — Z8042 Family history of malignant neoplasm of prostate: Secondary | ICD-10-CM

## 2019-11-16 DIAGNOSIS — D259 Leiomyoma of uterus, unspecified: Principal | ICD-10-CM | POA: Diagnosis present

## 2019-11-16 DIAGNOSIS — I10 Essential (primary) hypertension: Secondary | ICD-10-CM | POA: Diagnosis present

## 2019-11-16 DIAGNOSIS — D649 Anemia, unspecified: Secondary | ICD-10-CM | POA: Insufficient documentation

## 2019-11-16 DIAGNOSIS — N736 Female pelvic peritoneal adhesions (postinfective): Secondary | ICD-10-CM | POA: Diagnosis present

## 2019-11-16 DIAGNOSIS — Z20828 Contact with and (suspected) exposure to other viral communicable diseases: Secondary | ICD-10-CM | POA: Diagnosis present

## 2019-11-16 DIAGNOSIS — N939 Abnormal uterine and vaginal bleeding, unspecified: Secondary | ICD-10-CM

## 2019-11-16 DIAGNOSIS — N202 Calculus of kidney with calculus of ureter: Secondary | ICD-10-CM | POA: Insufficient documentation

## 2019-11-16 DIAGNOSIS — K219 Gastro-esophageal reflux disease without esophagitis: Secondary | ICD-10-CM | POA: Diagnosis present

## 2019-11-16 DIAGNOSIS — K589 Irritable bowel syndrome without diarrhea: Secondary | ICD-10-CM | POA: Insufficient documentation

## 2019-11-16 HISTORY — PX: CYSTOSCOPY: SHX5120

## 2019-11-16 HISTORY — PX: LYSIS OF ADHESION: SHX5961

## 2019-11-16 HISTORY — PX: HYSTERECTOMY ABDOMINAL WITH SALPINGECTOMY: SHX6725

## 2019-11-16 LAB — TYPE AND SCREEN
ABO/RH(D): A POS
Antibody Screen: NEGATIVE

## 2019-11-16 LAB — ABO/RH: ABO/RH(D): A POS

## 2019-11-16 SURGERY — HYSTERECTOMY, TOTAL, ABDOMINAL, WITH SALPINGECTOMY
Anesthesia: General | Site: Bladder

## 2019-11-16 MED ORDER — FENTANYL CITRATE (PF) 100 MCG/2ML IJ SOLN: 25.0000 ug | INTRAMUSCULAR | Status: DC | PRN

## 2019-11-16 MED ORDER — FENTANYL CITRATE (PF) 250 MCG/5ML IJ SOLN
INTRAMUSCULAR | Status: DC | PRN
  Administered 2019-11-16: 150 ug via INTRAVENOUS
  Administered 2019-11-16: 50 ug via INTRAVENOUS
  Administered 2019-11-16: 25 ug via INTRAVENOUS
  Administered 2019-11-16 (×3): 50 ug via INTRAVENOUS

## 2019-11-16 MED ORDER — ROCURONIUM BROMIDE 10 MG/ML (PF) SYRINGE
PREFILLED_SYRINGE | INTRAVENOUS | Status: AC
  Filled 2019-11-16: qty 10

## 2019-11-16 MED ORDER — VASOPRESSIN 20 UNIT/ML IV SOLN
INTRAVENOUS | Status: AC
  Filled 2019-11-16: qty 1

## 2019-11-16 MED ORDER — HYDROMORPHONE HCL 1 MG/ML IJ SOLN
INTRAMUSCULAR | Status: AC
  Filled 2019-11-16: qty 1

## 2019-11-16 MED ORDER — ROCURONIUM BROMIDE 10 MG/ML (PF) SYRINGE
PREFILLED_SYRINGE | INTRAVENOUS | Status: DC | PRN
  Administered 2019-11-16: 10 mg via INTRAVENOUS
  Administered 2019-11-16: 40 mg via INTRAVENOUS
  Administered 2019-11-16: 20 mg via INTRAVENOUS
  Administered 2019-11-16: 60 mg via INTRAVENOUS

## 2019-11-16 MED ORDER — BUPIVACAINE HCL (PF) 0.5 % IJ SOLN
INTRAMUSCULAR | Status: AC
  Filled 2019-11-16: qty 30

## 2019-11-16 MED ORDER — KETOROLAC TROMETHAMINE 30 MG/ML IJ SOLN
30.0000 mg | Freq: Four times a day (QID) | INTRAMUSCULAR | Status: DC
  Administered 2019-11-16 – 2019-11-18 (×6): 30 mg via INTRAVENOUS
  Filled 2019-11-16 (×6): qty 1

## 2019-11-16 MED ORDER — PANTOPRAZOLE SODIUM 40 MG PO TBEC
40.0000 mg | DELAYED_RELEASE_TABLET | Freq: Every day | ORAL | Status: DC
  Administered 2019-11-17 – 2019-11-18 (×2): 40 mg via ORAL
  Filled 2019-11-16 (×2): qty 1

## 2019-11-16 MED ORDER — MENTHOL 3 MG MT LOZG: 1.0000 | LOZENGE | OROMUCOSAL | Status: DC | PRN

## 2019-11-16 MED ORDER — KETOROLAC TROMETHAMINE 30 MG/ML IJ SOLN
INTRAMUSCULAR | Status: AC
  Filled 2019-11-16: qty 1

## 2019-11-16 MED ORDER — MIDAZOLAM HCL 2 MG/2ML IJ SOLN
INTRAMUSCULAR | Status: DC | PRN
  Administered 2019-11-16: 2 mg via INTRAVENOUS

## 2019-11-16 MED ORDER — FENTANYL CITRATE (PF) 250 MCG/5ML IJ SOLN
INTRAMUSCULAR | Status: AC
  Filled 2019-11-16: qty 5

## 2019-11-16 MED ORDER — PHENYLEPHRINE 40 MCG/ML (10ML) SYRINGE FOR IV PUSH (FOR BLOOD PRESSURE SUPPORT)
PREFILLED_SYRINGE | INTRAVENOUS | Status: DC | PRN
  Administered 2019-11-16: 120 ug via INTRAVENOUS

## 2019-11-16 MED ORDER — SODIUM CHLORIDE (PF) 0.9 % IJ SOLN
INTRAMUSCULAR | Status: AC
  Filled 2019-11-16: qty 50

## 2019-11-16 MED ORDER — OXYCODONE-ACETAMINOPHEN 5-325 MG PO TABS
1.0000 | ORAL_TABLET | Freq: Four times a day (QID) | ORAL | Status: DC | PRN
  Administered 2019-11-17: 2 via ORAL
  Administered 2019-11-18: 1 via ORAL
  Filled 2019-11-16: qty 1
  Filled 2019-11-16 (×2): qty 2

## 2019-11-16 MED ORDER — DOCUSATE SODIUM 100 MG PO CAPS
100.0000 mg | ORAL_CAPSULE | Freq: Two times a day (BID) | ORAL | Status: DC
  Administered 2019-11-16 – 2019-11-17 (×3): 100 mg via ORAL
  Filled 2019-11-16 (×4): qty 1

## 2019-11-16 MED ORDER — LIDOCAINE 2% (20 MG/ML) 5 ML SYRINGE
INTRAMUSCULAR | Status: AC
  Filled 2019-11-16: qty 15

## 2019-11-16 MED ORDER — IBUPROFEN 600 MG PO TABS
600.0000 mg | ORAL_TABLET | Freq: Four times a day (QID) | ORAL | Status: DC
  Administered 2019-11-17 – 2019-11-18 (×4): 600 mg via ORAL
  Filled 2019-11-16 (×4): qty 1

## 2019-11-16 MED ORDER — PHENYLEPHRINE 40 MCG/ML (10ML) SYRINGE FOR IV PUSH (FOR BLOOD PRESSURE SUPPORT)
PREFILLED_SYRINGE | INTRAVENOUS | Status: AC
  Filled 2019-11-16: qty 30

## 2019-11-16 MED ORDER — DEXAMETHASONE SODIUM PHOSPHATE 10 MG/ML IJ SOLN
INTRAMUSCULAR | Status: DC | PRN
  Administered 2019-11-16: 5 mg via INTRAVENOUS

## 2019-11-16 MED ORDER — ROCURONIUM BROMIDE 10 MG/ML (PF) SYRINGE
PREFILLED_SYRINGE | INTRAVENOUS | Status: AC
  Filled 2019-11-16: qty 30

## 2019-11-16 MED ORDER — BUPIVACAINE LIPOSOME 1.3 % IJ SUSP
20.0000 mL | INTRAMUSCULAR | Status: DC
  Filled 2019-11-16: qty 20

## 2019-11-16 MED ORDER — SODIUM CHLORIDE 0.9 % IV SOLN
INTRAVENOUS | Status: AC
  Filled 2019-11-16: qty 2

## 2019-11-16 MED ORDER — DEXAMETHASONE SODIUM PHOSPHATE 10 MG/ML IJ SOLN
INTRAMUSCULAR | Status: AC
  Filled 2019-11-16: qty 3

## 2019-11-16 MED ORDER — BUPIVACAINE LIPOSOME 1.3 % IJ SUSP
INTRAMUSCULAR | Status: DC | PRN
  Administered 2019-11-16: 16:00:00 30 mL

## 2019-11-16 MED ORDER — SIMETHICONE 80 MG PO CHEW
80.0000 mg | CHEWABLE_TABLET | Freq: Four times a day (QID) | ORAL | Status: DC | PRN
  Administered 2019-11-16: 80 mg via ORAL
  Filled 2019-11-16: qty 1

## 2019-11-16 MED ORDER — MIDAZOLAM HCL 2 MG/2ML IJ SOLN
INTRAMUSCULAR | Status: AC
  Filled 2019-11-16: qty 2

## 2019-11-16 MED ORDER — ONDANSETRON HCL 4 MG/2ML IJ SOLN: 4.0000 mg | Freq: Four times a day (QID) | INTRAMUSCULAR | Status: DC | PRN

## 2019-11-16 MED ORDER — NALOXONE HCL 0.4 MG/ML IJ SOLN: 0.4000 mg | INTRAMUSCULAR | Status: DC | PRN

## 2019-11-16 MED ORDER — LACTATED RINGERS IV SOLN: INTRAVENOUS | Status: DC

## 2019-11-16 MED ORDER — PROPOFOL 10 MG/ML IV BOLUS
INTRAVENOUS | Status: AC
  Filled 2019-11-16: qty 20

## 2019-11-16 MED ORDER — HYDROMORPHONE 1 MG/ML IV SOLN
INTRAVENOUS | Status: DC
  Administered 2019-11-16: 2.4 mg via INTRAVENOUS
  Administered 2019-11-17: 1.8 mg via INTRAVENOUS
  Administered 2019-11-17: 0.3 mg via INTRAVENOUS
  Filled 2019-11-16: qty 30

## 2019-11-16 MED ORDER — SODIUM CHLORIDE (PF) 0.9 % IJ SOLN
INTRAMUSCULAR | Status: AC
  Filled 2019-11-16: qty 10

## 2019-11-16 MED ORDER — SUGAMMADEX SODIUM 200 MG/2ML IV SOLN
INTRAVENOUS | Status: DC | PRN
  Administered 2019-11-16: 100 mg via INTRAVENOUS

## 2019-11-16 MED ORDER — METHYLENE BLUE 0.5 % INJ SOLN
INTRAVENOUS | Status: AC
  Filled 2019-11-16: qty 10

## 2019-11-16 MED ORDER — STERILE WATER FOR IRRIGATION IR SOLN
Status: DC | PRN
  Administered 2019-11-16: 1000 mL

## 2019-11-16 MED ORDER — DIPHENHYDRAMINE HCL 12.5 MG/5ML PO ELIX: 12.5000 mg | ORAL_SOLUTION | Freq: Four times a day (QID) | ORAL | Status: DC | PRN

## 2019-11-16 MED ORDER — SODIUM CHLORIDE 0.9% FLUSH: 9.0000 mL | INTRAVENOUS | Status: DC | PRN

## 2019-11-16 MED ORDER — ONDANSETRON HCL 4 MG/2ML IJ SOLN
INTRAMUSCULAR | Status: AC
  Filled 2019-11-16: qty 2

## 2019-11-16 MED ORDER — SUCCINYLCHOLINE CHLORIDE 200 MG/10ML IV SOSY
PREFILLED_SYRINGE | INTRAVENOUS | Status: AC
  Filled 2019-11-16: qty 30

## 2019-11-16 MED ORDER — LIDOCAINE 2% (20 MG/ML) 5 ML SYRINGE
INTRAMUSCULAR | Status: DC | PRN
  Administered 2019-11-16: 80 mg via INTRAVENOUS

## 2019-11-16 MED ORDER — DIPHENHYDRAMINE HCL 50 MG/ML IJ SOLN: 12.5000 mg | Freq: Four times a day (QID) | INTRAMUSCULAR | Status: DC | PRN

## 2019-11-16 MED ORDER — METHYLENE BLUE 0.5 % INJ SOLN
INTRAVENOUS | Status: DC | PRN
  Administered 2019-11-16: 2 mL via INTRAVENOUS

## 2019-11-16 MED ORDER — PROPOFOL 10 MG/ML IV BOLUS
INTRAVENOUS | Status: DC | PRN
  Administered 2019-11-16: 170 mg via INTRAVENOUS

## 2019-11-16 MED ORDER — ONDANSETRON HCL 4 MG/2ML IJ SOLN
INTRAMUSCULAR | Status: DC | PRN
  Administered 2019-11-16: 4 mg via INTRAVENOUS

## 2019-11-16 MED ORDER — HYDROMORPHONE HCL 1 MG/ML IJ SOLN
INTRAMUSCULAR | Status: DC | PRN
  Administered 2019-11-16: 1 mg via INTRAVENOUS

## 2019-11-16 MED ORDER — KETOROLAC TROMETHAMINE 30 MG/ML IJ SOLN
30.0000 mg | Freq: Once | INTRAMUSCULAR | Status: AC
  Administered 2019-11-16: 30 mg via INTRAVENOUS

## 2019-11-16 MED ORDER — ONDANSETRON HCL 4 MG PO TABS: 4.0000 mg | ORAL_TABLET | Freq: Four times a day (QID) | ORAL | Status: DC | PRN

## 2019-11-16 MED ORDER — VASOPRESSIN 20 UNIT/ML IV SOLN
INTRAVENOUS | Status: DC | PRN
  Administered 2019-11-16: 14:00:00 16 mL via INTRAMUSCULAR

## 2019-11-16 MED ORDER — SODIUM CHLORIDE 0.9 % IV SOLN
2.0000 g | INTRAVENOUS | Status: AC
  Administered 2019-11-16: 2 g via INTRAVENOUS

## 2019-11-16 MED ORDER — EPHEDRINE 5 MG/ML INJ
INTRAVENOUS | Status: AC
  Filled 2019-11-16: qty 10

## 2019-11-16 SURGICAL SUPPLY — 40 items
CANISTER SUCT 3000ML PPV (MISCELLANEOUS) ×5 IMPLANT
COVER WAND RF STERILE (DRAPES) ×5 IMPLANT
DECANTER SPIKE VIAL GLASS SM (MISCELLANEOUS) IMPLANT
DRAPE WARM FLUID 44X44 (DRAPES) IMPLANT
DRSG OPSITE POSTOP 4X10 (GAUZE/BANDAGES/DRESSINGS) ×5 IMPLANT
DURAPREP 26ML APPLICATOR (WOUND CARE) ×5 IMPLANT
ELECT BLADE 6.5 EXT (BLADE) ×2 IMPLANT
GAUZE 4X4 16PLY RFD (DISPOSABLE) ×5 IMPLANT
GLOVE BIO SURGEON STRL SZ7 (GLOVE) ×5 IMPLANT
GLOVE BIOGEL PI IND STRL 7.0 (GLOVE) ×9 IMPLANT
GLOVE BIOGEL PI INDICATOR 7.0 (GLOVE) ×6
GOWN STRL REUS W/ TWL LRG LVL3 (GOWN DISPOSABLE) ×9 IMPLANT
GOWN STRL REUS W/TWL LRG LVL3 (GOWN DISPOSABLE) ×15
HEMOSTAT ARISTA ABSORB 3G PWDR (HEMOSTASIS) ×2 IMPLANT
HIBICLENS CHG 4% 4OZ BTL (MISCELLANEOUS) ×5 IMPLANT
KIT TURNOVER KIT B (KITS) ×5 IMPLANT
LIGASURE IMPACT 36 18CM CVD LR (INSTRUMENTS) IMPLANT
NEEDLE HYPO 22GX1.5 SAFETY (NEEDLE) ×5 IMPLANT
PACK ABDOMINAL GYN (CUSTOM PROCEDURE TRAY) ×5 IMPLANT
PAD ARMBOARD 7.5X6 YLW CONV (MISCELLANEOUS) ×5 IMPLANT
PAD OB MATERNITY 4.3X12.25 (PERSONAL CARE ITEMS) ×5 IMPLANT
RTRCTR C-SECT PINK 25CM LRG (MISCELLANEOUS) ×2 IMPLANT
SET CYSTO W/LG BORE CLAMP LF (SET/KITS/TRAYS/PACK) IMPLANT
SPECIMEN JAR MEDIUM (MISCELLANEOUS) ×5 IMPLANT
SPONGE LAP 18X18 RF (DISPOSABLE) ×10 IMPLANT
SUT CHROMIC 2 0 CT 1 (SUTURE) ×4 IMPLANT
SUT PDS AB 0 CT 36 (SUTURE) IMPLANT
SUT PDS AB 0 CTX 60 (SUTURE) ×2 IMPLANT
SUT PLAIN 2 0 (SUTURE)
SUT PLAIN 2 0 XLH (SUTURE) ×4 IMPLANT
SUT PLAIN ABS 2-0 54XMFL TIE (SUTURE) IMPLANT
SUT VIC AB 0 CT1 27 (SUTURE) ×25
SUT VIC AB 0 CT1 27XBRD ANBCTR (SUTURE) ×6 IMPLANT
SUT VIC AB 0 CT1 27XCR 8 STRN (SUTURE) ×9 IMPLANT
SUT VIC AB 2-0 CT1 (SUTURE) ×5 IMPLANT
SUT VIC AB 4-0 KS 27 (SUTURE) ×5 IMPLANT
SUT VICRYL 0 TIES 12 18 (SUTURE) ×5 IMPLANT
SYR CONTROL 10ML LL (SYRINGE) ×5 IMPLANT
TOWEL GREEN STERILE FF (TOWEL DISPOSABLE) ×10 IMPLANT
TRAY FOLEY W/BAG SLVR 14FR (SET/KITS/TRAYS/PACK) ×5 IMPLANT

## 2019-11-16 NOTE — H&P (Signed)
Chelsea Neal is an 53 y.o. female  P4P1 with history of symptomatic fibroid uterus manifested by abnormal uterine bleeding.  EMBX confirmed negative for hyperplasia or malignancy.  Her LMP was 11/01/19.   Pertinent Gynecological History: Menses: flow is excessive with use of 8 pads or tampons on heaviest days Bleeding: dysfunctional uterine bleeding Contraception: none DES exposure: denies Blood transfusions: none Sexually transmitted diseases: no past history Previous GYN Procedures: DNC  Last mammogram: normal Date: 2020 Last pap: normal Date: 08/24/19 OB History: G4, P1   Menstrual History: Menarche age: 59 Patient's last menstrual period was 11/07/2019.    Past Medical History:  Diagnosis Date  . Anxiety   . Diverticulosis   . Fibroids   . GERD (gastroesophageal reflux disease)   . H/O menorrhagia   . History of kidney stones   . Hypertension   . IBS (irritable bowel syndrome)    constipation  . Panic attack     Past Surgical History:  Procedure Laterality Date  . BREAST CYST ASPIRATION Left   . bucal tumor removal Right   . CYSTOSCOPY/URETEROSCOPY/HOLMIUM LASER/STENT PLACEMENT Right 10/14/2016   Procedure: CYSTOSCOPY/URETEROSCOPY/HOLMIUM LASER/STENT PLACEMENT;  Surgeon: Hollice Espy, MD;  Location: [facility] ORS;  Service: Urology;  Laterality: Right;  . hammer toe repair Left    Dr. Paulla Dolly, [facility]  . UPPER GI ENDOSCOPY  11/12/2016   Dr Donnella Sham    Family History  Problem Relation Age of Onset  . Prostate cancer Father   . Breast cancer Paternal Grandmother   . Kidney cancer Neg Hx   . Bladder Cancer Neg Hx     Social History:  reports that she has never smoked. She has never used smokeless tobacco. She reports current alcohol use. She reports that she does not use drugs.  Allergies:  Allergies  Allergen Reactions  . Latex Hives    Medications Prior to Admission  Medication Sig Dispense Refill Last Dose  . acetaminophen (TYLENOL) 500 MG tablet  Take 1,000 mg by mouth every 6 (six) hours as needed for mild pain or headache.   Past Month at Unknown time  . Dexlansoprazole (DEXILANT) 30 MG capsule Take 30 mg by mouth daily.    11/16/2019 at Unknown time  . ferrous sulfate 325 (65 FE) MG EC tablet Take 325 mg by mouth 3 (three) times daily with meals.   Past Week at Unknown time  . valACYclovir (VALTREX) 500 MG tablet Take 500 mg by mouth daily as needed (shingles).    Past Month at Unknown time    Review of Systems  As per HPI  Blood pressure (!) 158/86, pulse 77, temperature 98 F (36.7 C), temperature source Oral, resp. rate 20, height 5\' 6"  (1.676 m), weight 96 kg, last menstrual period 11/07/2019, SpO2 100 %. Physical Exam Pelvic Exam deferred to OR Enlarged uterus @18  weeks size by last annual exam  Results for orders placed or performed during the hospital encounter of 11/16/19 (from the past 24 hour(s))  Type and screen [place]     Status: None (Preliminary result)   Collection Time: 11/16/19 11:04 AM  Result Value Ref Range   ABO/RH(D) PENDING    Antibody Screen PENDING    Sample Expiration      11/19/2019,2359 Performed at [facility], [place] [address], [place],  [address]     No results found.  Assessment/Plan: Ms.Dickman is a 53 yo with symptomatic fibroid uterus manifested by abnormal uterine bleeding. Benign endometrial  biopsy confirmed. [place] c/w large, multiple uterine fibroids. Treatment options discussed at length with the patient to include: no intervention, use of OCP's, Depo Lupron, Depo Provera, Mirena IUD, myomectomy, endometrial ablation, [place] and hysterectomy.  Pt desires definitive surgical intervention. The risks of a hysterectomy were discussed with the patient to include, but not limited to bleeding possibly requiring a transfusion, infection, wound breakdown or poor healing, damage to organs in the abdomen /pelvis w/ possible need for further surgical repair (like bowel or  bladder injury), need for abdominal incision to complete procedure, blood clots, PE/MI/stroke. We also discussed long term risk of post op prolapse, worsening of UI sx, possible need for [facility] or further surgery for repair.  All questions placed by patient answered. Patient voiced understanding and consents to surgery. We also discussed ovarian preservation. She was counseled about the 1/70 lifetime risk of ovarian cancer and 5-10% risk for future ovarian surgery after the hysterectomy. PT desires preservation of her ovaries.  On call to OR for TAH / Bilateral salpingectomy / possible cystoscopy  Ishaan Villamar 11/16/2019, 11:32 AM

## 2019-11-16 NOTE — Discharge Instructions (Signed)
Call [facility] @ [phone] if:  You have a temperature greater than or equal to 100.4 degrees Farenheit orally You have pain that is not made better by the pain medication given and taken as directed You have excessive bleeding or problems urinating  Take Colace (Docusate Sodium/Stool Softener) 100 mg 2-3 times daily while taking narcotic pain medicine to avoid constipation or until bowel movements are regular. Take with food, Ibuprofen 600 mg every 6 hours for 5 days then as needed for pain.  You may drive after 2 weeks You may walk up steps  You may shower  You may resume a regular diet  Keep incisions clean and dry and remove honeycomb dressing 11/23/2019 Do not lift over 15 pounds for 6 weeks Avoid anything in vagina for 6 weeks (or until after your post-operative visit)

## 2019-11-16 NOTE — Anesthesia Procedure Notes (Signed)
Procedure Name: Intubation Date/Time: 11/16/2019 1:25 PM Performed by: Janace Litten, CRNA Pre-anesthesia Checklist: Patient identified, Emergency Drugs available, Suction available and Patient being monitored Patient Re-evaluated:Patient Re-evaluated prior to induction Oxygen Delivery Method: Circle System Utilized Preoxygenation: Pre-oxygenation with 100% oxygen Induction Type: IV induction Ventilation: Mask ventilation without difficulty Laryngoscope Size: Mac and 3 Grade View: Grade I Tube type: Oral Tube size: 7.0 mm Number of attempts: 1 Airway Equipment and Method: Stylet and Oral airway Placement Confirmation: ETT inserted through vocal cords under direct vision,  positive ETCO2 and breath sounds checked- equal and bilateral Secured at: 22 cm Tube secured with: Tape Dental Injury: Teeth and Oropharynx as per pre-operative assessment

## 2019-11-16 NOTE — Transfer of Care (Signed)
Immediate Anesthesia Transfer of Care Note  Patient: ROANNA SCHAMBACH  Procedure(s) Performed: SUPRACERVICAL ABDOMINAL  WITH BILATERAL SALPINGECTOMY, MYOMECTOMY (Bilateral Abdomen) CYSTOSCOPY (N/A Bladder) LYSIS OF ADHESIONS (N/A Abdomen)  Patient Location: PACU  Anesthesia Type:General  Level of Consciousness: drowsy and patient cooperative  Airway & Oxygen Therapy: Patient Spontanous Breathing  Post-op Assessment: Report given to RN and Post -op Vital signs reviewed and stable  Post vital signs: Reviewed and stable  Last Vitals:  Vitals Value Taken Time  BP 149/80 11/16/19 1711  Temp 36.4 C 11/16/19 1721  Pulse 73 11/16/19 1724  Resp 0 11/16/19 1724  SpO2 100 % 11/16/19 1724  Vitals shown include unvalidated device data.  Last Pain:  Vitals:   11/16/19 1627  TempSrc:   PainSc: Asleep      Patients Stated Pain Goal: 3 ([id number])  Complications: No apparent anesthesia complications

## 2019-11-16 NOTE — Progress Notes (Signed)
Pt arrived to 6n7 at this time. Daughter at bedside

## 2019-11-16 NOTE — Anesthesia Preprocedure Evaluation (Signed)
Anesthesia Evaluation  Patient identified by MRN, date of birth, ID band Patient awake    Reviewed: Allergy & Precautions, NPO status , Patient's Chart, lab work & pertinent test results  Airway Mallampati: II  TM Distance: >3 FB     Dental   Pulmonary neg pulmonary ROS,    breath sounds clear to auscultation       Cardiovascular hypertension,  Rhythm:Regular Rate:Normal     Neuro/Psych    GI/Hepatic Neg liver ROS, GERD  ,  Endo/Other  negative endocrine ROS  Renal/GU negative Renal ROS     Musculoskeletal   Abdominal   Peds  Hematology   Anesthesia Other Findings   Reproductive/Obstetrics                             Anesthesia Physical Anesthesia Plan  ASA: III  Anesthesia Plan: General   Post-op Pain Management:    Induction: Intravenous  PONV Risk Score and Plan: 3  Airway Management Planned: Oral ETT  Additional Equipment:   Intra-op Plan:   Post-operative Plan: Extubation in OR  Informed Consent: I have reviewed the patients History and Physical, chart, labs and discussed the procedure including the risks, benefits and alternatives for the proposed anesthesia with the patient or authorized representative who has indicated his/her understanding and acceptance.     Dental advisory given  Plan Discussed with: CRNA and Anesthesiologist  Anesthesia Plan Comments:         Anesthesia Quick Evaluation

## 2019-11-16 NOTE — Anesthesia Postprocedure Evaluation (Signed)
Anesthesia Post Note  Patient: Chelsea Neal  Procedure(s) Performed: SUPRACERVICAL ABDOMINAL  WITH BILATERAL SALPINGECTOMY, MYOMECTOMY (Bilateral Abdomen) CYSTOSCOPY (N/A Bladder) LYSIS OF ADHESIONS (N/A Abdomen)     Patient location during evaluation: PACU Anesthesia Type: General Level of consciousness: awake and alert Pain management: pain level controlled Vital Signs Assessment: post-procedure vital signs reviewed and stable Respiratory status: spontaneous breathing, nonlabored ventilation, respiratory function stable and patient connected to nasal cannula oxygen Cardiovascular status: blood pressure returned to baseline and stable Postop Assessment: no apparent nausea or vomiting Anesthetic complications: no    Last Vitals:  Vitals:   11/16/19 1803 11/16/19 2012  BP: (!) 151/90   Pulse: 73   Resp: 18 18  Temp: 36.5 C   SpO2: 100% 100%    Last Pain:  Vitals:   11/16/19 2012  TempSrc:   PainSc: 9                  Tiajuana Amass

## 2019-11-16 NOTE — Op Note (Signed)
OPERATIVE NOTE   KINDA OEN DOB: [birth year]  [id number]  Date of Surgery: November 16, 2019  Preoperative Diagnosis:  Symptomatic Fibroid uterus Abnormal uterine bleeding  Postoperative Diagnosis:  Same as above   Procedure (s):  1. Exploratory Laparotomy  2. Myomectomy 3. Supracervical hysterectomy  4. Bilateral salpingectomy  5. Lysis of Adhesions 6. Cystoscopy    Surgeon:  Mady Haagensen. Alwyn Pea, M.D.   Assistant:  Earnstine Regal, P.A.  Anesthetic:  General Endotracheal   Fluids: 1980mL LR   UOP: 750 mL   EBL: 350 mL   Catheter: Foley during case   Complications: none.   Medications: Cefotetan 2GM IV   Findings:  Enlarged fibroid uterus 18 weeks size, several multiple fundal and anterior fibroid removed, largest approximately 5cm in diameter.Normal bilateral ovaries, normal bilateral fallopian tubes.  Left hemipelvis with extensive pelvic adhesive disease. The rectum was adherent to the posterior aspect of the cervix.  Lysis of adhesions had to be performed to delineate normal anatomy. Normal appendix.  Cystoscopy: Normal bladder urothelium, brisk bilateral ureteral jets   Technique:  After adequate general anesthesia was achieved, the patient was prepped and draped in the usual sterile fashion after the foley had been placed. A Pfannenstiel incision was made with the scalpel 2 fingerbreadth's above the symphysis pubis and carried down to the fascia. The fascia was incised in the middle with the Bovie in a transverse manner and extended in a transverse curvilinear manner with Mayo scissors. The rectus muscles were split in the midline and a bowel free portion of the peritoneum was tented up. It was entered into with the hemostat and extended in a superior and inferior manner with good visualization of the bowel and bladder. The Alexis retractor was deployed and the bowel was packed away. The uterus tented up out of the abdominal cavity with Kelly clamps on the cornua  bilaterally.  Visualization was poor so decision was made to perform a myomectomy to debulk the uterus.  The fundal fibroid was identified. It was then injected with vasopressin, 20 units mixed in 30 cc of normal saline along the serosal surface and careful to aspirate to avoid any blood vessels. 15 cc was injected. Next, the bovie cautery was used to cut the linear incision along the top of the _ fibroid until fibroid fibers were seen. The edges of the myometrium were bluntly pushed from around the fibroid and the fibroid was easily grasped with towel clamp for traction and with blunt dissection was removed from the uterus and handed off to the scrub nurse.  This was done for two more fundal / anterior fibroids. Visualization was much improved and the hysterectomy was initiated.   The round ligaments were suture ligated with 0-vicryl bilaterally and incised with the bovie. The bovie was then used to incise a transverse curvilinear incision in the vesico-cervical fascia. The ureters were visualized bilaterally coursing well beneath the operative field. The mesosalpinx was clamped with kelly clamp and incised using the bovie. The utero-ovarian ligament was then entered with the bovie, curved heaney clamp placed and incised with Mayo scissors. Each pedicle of the utero ovarian ligament was secured with a free tie and then suture ligated with 0-vicryl. The same was performed on the contralateral side. The bladder flap was then retracted inferiorly with blunt and cautery dissection below the external cervical os. The uterine arteries were clamped bilaterally with the heaneys and incised with the mayos. Each pedicle was then secured with a stitch of 0-vicryl. The  Uterine corpus was then incised off the cervix with the bovie with the fallopian tubes attached and handed off the field to be sent to Pathology. There was notable attachment posteriorly of the rectum to the posterior edge of the cervix.  Attempt made to  mobilize the rectum off the cervix unsuccessful.  There was bleeding from the posterior peritoneum that was alleviated with several sutures of 0- vicryl.  Decision made to leave the cervix en vivo.   The endocervical canal was cauterized with the bovie. The cervical edges were imbricated inward with interrupted sutures of 0- vicryl. The peritoneum was sutured over the cervix in running, locked fashion with 0-vicryl. There was small bleeding from posterior peritoneum that was alleviated by interrupted sutures. Hemostasis was insured and the ureters were peristalsing well out of the field of dissection. Irrigation was performed and hemostasis was assured. Arista powder was placed over the pedicle sites and cervical stump edge.   The instruments and laps were removed from the abdominal cavity and the peritoneum was closed with a running stitch of 0-chromic.The fascia was closed with looped 0-PDS in a running manner. The subcutaneous tissue was irrigated and perforating bleeders alleviated with the bovie. 30 cc of Experil and 0.5% Marcaine dilution was infiltrated into the subcutaneous layer for post operative pain management.  Several interrupted sutures of 2-0 plain gut were used to close the subcutaneous layer.  The skin was closed with 3-0 vicryl sub-q with a keith needle. Steri strips and honeycomb bandage was placed.   Patient was then placed in dorsal lithotomy position, the foley removed, and a 70 degree cystoscopy placed into the urethra. The bladder was entered under direct visualization. Approximately 330mL of sterile water was placed into the bladder. A complete bladder survey was performed and the urothelium noted to be normal. Both ureteral orifices visualized and brisk jets seen. The cystoscopic portion of the procedure was then ended.   Instrument, lap and needle count were correct x 3  Pt tolerated the procedure well and was transferred to the recovery room in good condition.   Sanjuana Kava  MD

## 2019-11-17 ENCOUNTER — Other Ambulatory Visit: Payer: Self-pay

## 2019-11-17 LAB — CBC
HCT: 38.1 % (ref 36.0–46.0)
Hemoglobin: 12.6 g/dL (ref 12.0–15.0)
MCH: 27.8 pg (ref 26.0–34.0)
MCHC: 33.1 g/dL (ref 30.0–36.0)
MCV: 83.9 fL (ref 80.0–100.0)
Platelets: 255 10*3/uL (ref 150–400)
RBC: 4.54 MIL/uL (ref 3.87–5.11)
RDW: 17.7 % — ABNORMAL HIGH (ref 11.5–15.5)
WBC: 11.7 10*3/uL — ABNORMAL HIGH (ref 4.0–10.5)
nRBC: 0 % (ref 0.0–0.2)

## 2019-11-17 LAB — BASIC METABOLIC PANEL
Anion gap: 10 (ref 5–15)
BUN: 8 mg/dL (ref 6–20)
CO2: 24 mmol/L (ref 22–32)
Calcium: 8.9 mg/dL (ref 8.9–10.3)
Chloride: 100 mmol/L (ref 98–111)
Creatinine, Ser: 0.96 mg/dL (ref 0.44–1.00)
GFR calc Af Amer: >60 mL/min (ref >60)
GFR calc non Af Amer: >60 mL/min (ref >60)
Glucose, Bld: 143 mg/dL — ABNORMAL HIGH (ref 70–99)
Potassium: 4.2 mmol/L (ref 3.5–5.1)
Sodium: 134 mmol/L — ABNORMAL LOW (ref 135–145)

## 2019-11-17 NOTE — Progress Notes (Signed)
AVEE PARELLO is a40 y.o.  [id number]  Post Op Date #: Supra-cervical Hysterectomy/Bilateral Salpingectomy/ Lysis of Adhesions/Cystocopy2  Subjective: Patient is Doing well postoperatively. Patient has Pain is controlled with current analgesics. Medications being used: prescription NSAID's including Ketorolac 30 mg and narcotic analgesics including PCA Dilaudid. The patient has not gotten up yet, is tolerating liquids, has not passed flatus and just had Foley removed.   Objective: Vital signs in last 24 hours: Temp:  [97.5 F (36.4 C)-98.3 F (36.8 C)] 97.9 F (36.6 C) (12/17 0405) Pulse Rate:  [73-94] 86 (12/17 0405) Resp:  [7-20] 15 (12/17 0455) BP: (108-163)/(59-108) 110/75 (12/17 0405) SpO2:  [95 %-100 %] 96 % (12/17 0455) Weight:  [95.3 kg-96 kg] 95.3 kg (12/16 1803)  Intake/Output from previous day: 12/16 0701 - 12/17 0700 In: 2000 [I.V.:1900] Out: 2700 [Urine:2350] Intake/Output this shift: Total I/O In: -  Out: 1200 [Urine:1200] Recent Labs  Lab 11/14/19 0811 11/17/19 0137  WBC 3.9* 11.7*  HGB 13.4 12.6  HCT 41.3 38.1  PLT 218 255     Recent Labs  Lab 11/14/19 0811 11/17/19 0137  NA 139 134*  K 4.6 4.2  CL 106 100  CO2 25 24  BUN 10 8  CREATININE 0.88 0.96  CALCIUM 9.2 8.9  GLUCOSE 97 143*    EXAM: General: alert, cooperative and no distress Resp: clear to auscultation bilaterally Cardio: regular rate and rhythm, S1, S2 normal, no murmur, click, rub or gallop GI: bowel sounds decreased but present; soft; wound dressing is clean/dry/intact Extremities: Homans sign is negative, no sign of DVT and no calf tenderness and SCD hose in place and functioning.   Assessment: s/p Procedure(s): SUPRACERVICAL ABDOMINAL  WITH BILATERAL SALPINGECTOMY, MYOMECTOMY CYSTOSCOPY LYSIS OF ADHESIONS: stable and progressing well  Plan: Advance diet Encourage ambulation Advance to PO medication Routine care  LOS: 1 day    Earnstine Regal, PA-C 11/17/2019 6:44  AM

## 2019-11-17 NOTE — Plan of Care (Signed)

## 2019-11-17 NOTE — Plan of Care (Signed)
  Problem: Clinical Measurements: Goal: Ability to maintain clinical measurements within normal limits will improve Outcome: Progressing Goal: Will remain free from infection Outcome: Progressing   

## 2019-11-18 LAB — SURGICAL PATHOLOGY

## 2019-11-18 MED ORDER — IBUPROFEN 600 MG PO TABS: ORAL_TABLET | ORAL | 1 refills | Status: AC

## 2019-11-18 MED ORDER — OXYCODONE-ACETAMINOPHEN 5-325 MG PO TABS: ORAL_TABLET | ORAL | 0 refills | Status: DC

## 2019-11-18 NOTE — Discharge Summary (Signed)
Physician Discharge Summary  Patient ID: Chelsea Neal MRN: [id number] DOB/AGE: 06-Sep-1966 53 y.o.  Admit date: 11/16/2019 Discharge date: 11/18/2019   Discharge Diagnoses:  Active Problems:   Leiomyoma of uterus   Fibroid, uterine Abnormal Uterine Bleeding  Operation: Laparotomy, Myomectomy,  Supracervical Hysterectomy, Lysis of Adhesions, Bilateral Salpingectomy and Cystosocpy   Discharged Condition: Good  Hospital Course: On the date of admission the patient underwent the aforementioned procedures and tolerated them well.  Post operative course was unremarkable with the patient resuming bowel and bladder function by post operative day #2 and was therefore deemed ready for discharge home.  Discharge hemoglobin was 12.6.  Disposition: Discharge disposition: 01-Home or Self Care       Discharge Medications:  Allergies as of 11/18/2019      Reactions   Latex Hives      Medication List    STOP taking these medications   acetaminophen 500 MG tablet Commonly known as: TYLENOL     TAKE these medications   Dexilant 30 MG capsule Generic drug: Dexlansoprazole Take 30 mg by mouth daily.   ferrous sulfate 325 (65 FE) MG EC tablet Take 325 mg by mouth 3 (three) times daily with meals.   ibuprofen 600 MG tablet Commonly known as: ADVIL take 1 tablet po pc every 6 hours for 5 days then prn- post operative pain   oxyCODONE-acetaminophen 5-325 MG tablet Commonly known as: PERCOCET/ROXICET take 1 tablet po every 6 hours prn for post operative pain   valACYclovir 500 MG tablet Commonly known as: VALTREX Take 500 mg by mouth daily as needed (shingles).          Follow-up: Dr. Sanjuana Kava on December 26, 2018 at 4:15 p.m.   Signed: Earnstine Regal, PA-C 11/18/2019, 7:03 AM

## 2019-11-18 NOTE — Plan of Care (Signed)
Pt for discharge going home, alert and oriented, room air, wound site dry and intact, no bleeding noted, she can tolerates her meal, ambulates going to the bathroom, given health teachings, next appointment, due med explained and understood, discontinued peripheral IV line, waiting for her daughter to pick her up.

## 2019-11-18 NOTE — Progress Notes (Addendum)
Chelsea Neal is a1 y.o.  [id number]  Post Op Date # 2: Supra-cervical Hysterectomy/Bilateral Salpingectomy/ Lysis of Adhesions/Cystocopy   Subjective: Patient is Doing well postoperatively. Patient has Pain is controlled with current analgesics. Medications being used: prescription NSAID's including Ibuprofen 600 mg and narcotic analgesics including Percocet 5/325. Tolerating a regular diet, ambulating without difficulty and voiding well.  Objective: Vital signs in last 24 hours: Temp:  [97.8 F (36.6 C)-98.7 F (37.1 C)] 98.7 F (37.1 C) (12/18 0611) Pulse Rate:  [64-84] 75 (12/18 0611) Resp:  [14-20] 18 (12/18 0611) BP: (122-142)/(73-89) 142/89 (12/18 0611) SpO2:  [98 %-100 %] 100 % (12/18 [id number])  Intake/Output from previous day: 12/17 0701 - 12/18 0700 In: [id number] [P.O.:120; I.V.:1319] Out: -  Intake/Output this shift: Total I/O In: 1439 [P.O.:120; I.V.:1319] Out: -  Recent Labs  Lab 11/14/19 0811 11/17/19 0137  WBC 3.9* 11.7*  HGB 13.4 12.6  HCT 41.3 38.1  PLT 218 255     Recent Labs  Lab 11/14/19 0811 11/17/19 0137  NA 139 134*  K 4.6 4.2  CL 106 100  CO2 25 24  BUN 10 8  CREATININE 0.88 0.96  CALCIUM 9.2 8.9  GLUCOSE 97 143*    EXAM: General: alert, cooperative and no distress Resp: clear to auscultation bilaterally Cardio: regular rate and rhythm, S1, S2 normal, no murmur, click, rub or gallop GI: bowel sounds present, dressing is clean/dry/intact  Extremities: Homans sign is negative, no sign of DVT and no calf tenderness   Assessment: s/p Procedure(s): SUPRACERVICAL ABDOMINAL  WITH BILATERAL SALPINGECTOMY, MYOMECTOMY CYSTOSCOPY LYSIS OF ADHESIONS: stable, progressing well and tolerating diet  Plan: Discharge home  LOS: 2 days    Earnstine Regal, PA-C 11/18/2019 6:54 AM

## 2021-10-15 ENCOUNTER — Other Ambulatory Visit: Payer: Self-pay | Admitting: Obstetrics & Gynecology

## 2021-10-15 DIAGNOSIS — R102 Pelvic and perineal pain: Secondary | ICD-10-CM

## 2021-10-16 ENCOUNTER — Other Ambulatory Visit: Payer: Self-pay | Admitting: Obstetrics & Gynecology

## 2021-10-16 DIAGNOSIS — R102 Pelvic and perineal pain: Secondary | ICD-10-CM

## 2021-11-11 ENCOUNTER — Ambulatory Visit
Admission: RE | Admit: 2021-11-11 | Discharge: 2021-11-11 | Disposition: A | Discharge status: Home / Self Care | Payer: Managed Care, Other (non HMO) | Source: Ambulatory Visit | Attending: Obstetrics & Gynecology | Admitting: Obstetrics & Gynecology

## 2021-11-11 ENCOUNTER — Other Ambulatory Visit: Payer: Self-pay

## 2021-11-11 DIAGNOSIS — R102 Pelvic and perineal pain: Secondary | ICD-10-CM

## 2021-11-11 MED ORDER — IOPAMIDOL (ISOVUE-300) INJECTION 61%
100.0000 mL | Freq: Once | INTRAVENOUS | Status: AC | PRN
  Administered 2021-11-11: 100 mL via INTRAVENOUS

## 2021-11-28 DIAGNOSIS — K760 Fatty (change of) liver, not elsewhere classified: Secondary | ICD-10-CM | POA: Insufficient documentation

## 2021-12-20 ENCOUNTER — Encounter: Payer: Self-pay | Admitting: Podiatry

## 2021-12-20 ENCOUNTER — Ambulatory Visit: Payer: Managed Care, Other (non HMO) | Admitting: Podiatry

## 2021-12-20 ENCOUNTER — Ambulatory Visit (INDEPENDENT_AMBULATORY_CARE_PROVIDER_SITE_OTHER): Payer: Managed Care, Other (non HMO)

## 2021-12-20 ENCOUNTER — Other Ambulatory Visit: Payer: Self-pay

## 2021-12-20 ENCOUNTER — Other Ambulatory Visit: Payer: Self-pay | Admitting: Podiatry

## 2021-12-20 DIAGNOSIS — M205X1 Other deformities of toe(s) (acquired), right foot: Secondary | ICD-10-CM

## 2021-12-20 DIAGNOSIS — M722 Plantar fascial fibromatosis: Secondary | ICD-10-CM

## 2021-12-20 DIAGNOSIS — M7751 Other enthesopathy of right foot: Secondary | ICD-10-CM

## 2021-12-20 DIAGNOSIS — N920 Excessive and frequent menstruation with regular cycle: Secondary | ICD-10-CM | POA: Insufficient documentation

## 2021-12-20 DIAGNOSIS — R102 Pelvic and perineal pain: Secondary | ICD-10-CM | POA: Insufficient documentation

## 2021-12-20 MED ORDER — METHYLPREDNISOLONE 4 MG PO TBPK: ORAL_TABLET | ORAL | 0 refills | Status: AC

## 2021-12-20 MED ORDER — BETAMETHASONE SOD PHOS & ACET 6 (3-3) MG/ML IJ SUSP
3.0000 mg | Freq: Once | INTRAMUSCULAR | Status: AC
  Administered 2021-12-20: 3 mg via INTRA_ARTICULAR

## 2021-12-20 MED ORDER — MELOXICAM 15 MG PO TABS: 15.0000 mg | ORAL_TABLET | Freq: Every day | ORAL | 1 refills | Status: AC

## 2021-12-20 NOTE — Progress Notes (Signed)
° °  Subjective: 56 y.o. female presenting as a new patient for evaluation of left heel pain this been going on for quite a few months now as well and has right great toe pain.  Patient states that the right great toe pain began about 6 weeks ago after purchasing a new pair of shoes.  The left heel pain has been ongoing for few months now.  She has not done anything for treatment.  She presents for further treatment and evaluation   Past Medical History:  Diagnosis Date   Anxiety    Diverticulosis    Fibroids    GERD (gastroesophageal reflux disease)    H/O menorrhagia    History of kidney stones    Hypertension    PATIENT DENIES   IBS (irritable bowel syndrome)    constipation   Panic attack      Objective: Physical Exam General: The patient is alert and oriented x3 in no acute distress.  Dermatology: Skin is warm, dry and supple bilateral lower extremities. Negative for open lesions or macerations bilateral.   Vascular: Dorsalis Pedis and Posterior Tibial pulses palpable bilateral.  Capillary fill time is immediate to all digits.  Neurological: Epicritic and protective threshold intact bilateral.   Musculoskeletal: Tenderness to palpation to the plantar aspect of the left heel along the plantar fascia. All other joints range of motion within normal limits bilateral. Strength 5/5 in all groups bilateral.  There is some slight tenderness to palpation to the first MTP joint of the right foot.  Radiographic exam: Normal osseous mineralization. Joint spaces preserved. No fracture/dislocation/boney destruction. No other soft tissue abnormalities or radiopaque foreign bodies.   Assessment: 1. Plantar fasciitis left foot 2.  First MTP capsulitis right secondary to shoes  Plan of Care:  1. Patient evaluated. Xrays reviewed.   2. Injection of 0.5cc Celestone soluspan injected into the left plantar fascia.  3. Rx for Medrol Dose Pak placed 4. Rx for Meloxicam ordered for patient. 5.   In regards to the right inflammation of the forefoot, after discussing with the patient I do believe it is coming from new shoe gear.  Her symptoms began when she purchased a new pair of dress shoes.  Discontinue those dress shoes and recommend [employer] shoes.  6. Instructed patient regarding therapies and modalities at home to alleviate symptoms.  7. Return to clinic in 4 weeks.    *Management at [facility], DPM [facility]  Dr. Edrick Kins, DPM    [address], [place] [address]                Office [phone]  Fax [phone]

## 2022-01-24 ENCOUNTER — Other Ambulatory Visit: Payer: Self-pay

## 2022-01-24 ENCOUNTER — Encounter: Payer: Self-pay | Admitting: Podiatry

## 2022-01-24 ENCOUNTER — Ambulatory Visit: Payer: Managed Care, Other (non HMO) | Admitting: Podiatry

## 2022-01-24 DIAGNOSIS — M7751 Other enthesopathy of right foot: Secondary | ICD-10-CM

## 2022-01-24 DIAGNOSIS — M722 Plantar fascial fibromatosis: Secondary | ICD-10-CM

## 2022-01-31 DIAGNOSIS — M722 Plantar fascial fibromatosis: Secondary | ICD-10-CM | POA: Diagnosis not present

## 2022-01-31 DIAGNOSIS — M7751 Other enthesopathy of right foot: Secondary | ICD-10-CM | POA: Diagnosis not present

## 2022-01-31 MED ORDER — BETAMETHASONE SOD PHOS & ACET 6 (3-3) MG/ML IJ SUSP
3.0000 mg | Freq: Once | INTRAMUSCULAR | Status: AC
  Administered 2022-01-31: 3 mg via INTRA_ARTICULAR

## 2022-01-31 NOTE — Progress Notes (Signed)
° °  Subjective: 56 y.o. female presenting today for follow-up evaluation of plantar fasciitis to the left foot as well as right great toe capsulitis.  Patient states that the left foot is much better however the right foot continues to have some residual pain and tenderness.  She completed a Medrol Dosepak and is taking the meloxicam as instructed.  She continues to have pain especially to the right great toe joint despite shoe gear modifications.  She also has some slight residual pain to the left plantar fascia however there is significant improvement.   Past Medical History:  Diagnosis Date   Anxiety    Diverticulosis    Fibroids    GERD (gastroesophageal reflux disease)    H/O menorrhagia    History of kidney stones    Hypertension    PATIENT DENIES   IBS (irritable bowel syndrome)    constipation   Panic attack      Objective: Physical Exam General: The patient is alert and oriented x3 in no acute distress.  Dermatology: Skin is warm, dry and supple bilateral lower extremities. Negative for open lesions or macerations bilateral.   Vascular: Dorsalis Pedis and Posterior Tibial pulses palpable bilateral.  Capillary fill time is immediate to all digits.  Neurological: Epicritic and protective threshold intact bilateral.   Musculoskeletal: There continues to be some tenderness to palpation range of motion of the first MTP of the right foot as well as some tenderness palpation along the plantar aspect of the left heel along the plantar fascia.  Assessment: 1. Plantar fasciitis left foot 2.  First MTP capsulitis right secondary to shoes  Plan of Care:  1. Patient evaluated. 2. Injection of 0.5cc Celestone soluspan injected into the left plantar fascia as well as the first MTP joint of the right foot.  3.  Continue meloxicam 15 mg daily 4.  Continue wearing good supportive shoes and sneakers that do not constrict the toebox area and support the arches of the foot 5.  Return to  clinic in 4 weeks  *Management at [facility], DPM [facility]  Dr. Edrick Kins, DPM    [address], [place] [address]                Office [phone]  Fax [phone]

## 2022-02-21 ENCOUNTER — Other Ambulatory Visit: Payer: Self-pay

## 2022-02-21 ENCOUNTER — Ambulatory Visit (INDEPENDENT_AMBULATORY_CARE_PROVIDER_SITE_OTHER): Payer: Managed Care, Other (non HMO) | Admitting: Podiatry

## 2022-02-21 ENCOUNTER — Encounter: Payer: Self-pay | Admitting: Podiatry

## 2022-02-21 DIAGNOSIS — M722 Plantar fascial fibromatosis: Secondary | ICD-10-CM | POA: Diagnosis not present

## 2022-03-02 NOTE — Progress Notes (Signed)
? ?  HPI: 56 y.o. female presenting today for follow-up evaluation of plantar fasciitis to the left foot as well as first MTP capsulitis of the right foot.  Patient states that she is doing much better.  She no longer has the pain or tenderness associated to the bilateral feet.  She says the injections helped significantly.  She took the meloxicam as instructed.  No new complaints this time ? ?Past Medical History:  ?Diagnosis Date  ? Anxiety   ? Diverticulosis   ? Fibroids   ? GERD (gastroesophageal reflux disease)   ? H/O menorrhagia   ? History of kidney stones   ? Hypertension   ? PATIENT DENIES  ? IBS (irritable bowel syndrome)   ? constipation  ? Panic attack   ? ? ?Past Surgical History:  ?Procedure Laterality Date  ? BREAST CYST ASPIRATION Left   ? bucal tumor removal Right   ? CYSTOSCOPY N/A 11/16/2019  ? Procedure: CYSTOSCOPY;  Surgeon: Sanjuana Kava, MD;  Location: [place];  Service: Gynecology;  Laterality: N/A;  ? CYSTOSCOPY/URETEROSCOPY/HOLMIUM LASER/STENT PLACEMENT Right 10/14/2016  ? Procedure: CYSTOSCOPY/URETEROSCOPY/HOLMIUM LASER/STENT PLACEMENT;  Surgeon: Hollice Espy, MD;  Location: [facility];  Service: Urology;  Laterality: Right;  ? hammer toe repair Left   ? Dr. Paulla Dolly, [place]  ? HYSTERECTOMY ABDOMINAL WITH SALPINGECTOMY Bilateral 11/16/2019  ? Procedure: SUPRACERVICAL ABDOMINAL  WITH BILATERAL SALPINGECTOMY, MYOMECTOMY;  Surgeon: Sanjuana Kava, MD;  Location: [place];  Service: Gynecology;  Laterality: Bilateral;  ? LYSIS OF ADHESION N/A 11/16/2019  ? Procedure: LYSIS OF ADHESIONS;  Surgeon: Sanjuana Kava, MD;  Location: [place];  Service: Gynecology;  Laterality: N/A;  ? UPPER GI ENDOSCOPY  11/12/2016  ? Dr Donnella Sham  ? ? ?Allergies  ?Allergen Reactions  ? Latex Hives  ? ?  ?Physical Exam: ?General: The patient is alert and oriented x3 in no acute distress. ? ?Dermatology: Skin is warm, dry and supple bilateral lower extremities. Negative for open lesions or macerations. ? ?Vascular: Palpable  pedal pulses bilaterally. Capillary refill within normal limits.  Negative for any significant edema or erythema ? ?Neurological: Light touch and protective threshold grossly intact ? ?Musculoskeletal Exam: No pedal deformities noted.  No significant tenderness to palpation along the plantar fascia left or the first MTP right with range of motion or palpation ?   ? ?Assessment: ?1.  Plantar fasciitis left; resolved ?2.  First MTP capsulitis right; resolved ? ? ?Plan of Care:  ?1. Patient evaluated.  ?2.  Continue meloxicam 15 mg daily as needed ?3.  Continue wearing good supportive shoes and sneakers that do not irritate the feet ?4.  Return to clinic as needed ? ?*Management at [facility] ? ?  ?  ?Edrick Kins, DPM ?[place] ? ?Dr. Edrick Kins, DPM  ?  ?[address]                                        ?[place], [place] [address]                ?Office [phone]  ?Fax [phone] ? ? ? ? ?

## 2022-03-19 ENCOUNTER — Other Ambulatory Visit: Payer: Self-pay | Admitting: Gastroenterology

## 2022-03-19 DIAGNOSIS — K760 Fatty (change of) liver, not elsewhere classified: Secondary | ICD-10-CM

## 2022-03-19 DIAGNOSIS — R7989 Other specified abnormal findings of blood chemistry: Secondary | ICD-10-CM

## 2022-03-25 ENCOUNTER — Ambulatory Visit
Admission: RE | Admit: 2022-03-25 | Discharge: 2022-03-25 | Disposition: A | Discharge status: Home / Self Care | Payer: Managed Care, Other (non HMO) | Source: Ambulatory Visit | Attending: Gastroenterology | Admitting: Gastroenterology

## 2022-03-25 DIAGNOSIS — K760 Fatty (change of) liver, not elsewhere classified: Secondary | ICD-10-CM | POA: Diagnosis not present

## 2022-03-25 DIAGNOSIS — R7989 Other specified abnormal findings of blood chemistry: Secondary | ICD-10-CM | POA: Insufficient documentation

## 2022-04-22 ENCOUNTER — Other Ambulatory Visit: Payer: Self-pay | Admitting: Gastroenterology

## 2022-04-22 DIAGNOSIS — R1011 Right upper quadrant pain: Secondary | ICD-10-CM

## 2022-04-22 DIAGNOSIS — K760 Fatty (change of) liver, not elsewhere classified: Secondary | ICD-10-CM

## 2022-05-12 ENCOUNTER — Encounter
Admission: RE | Admit: 2022-05-12 | Discharge: 2022-05-12 | Disposition: A | Discharge status: Home / Self Care | Payer: Managed Care, Other (non HMO) | Source: Ambulatory Visit | Attending: Gastroenterology | Admitting: Gastroenterology

## 2022-05-12 DIAGNOSIS — R1011 Right upper quadrant pain: Secondary | ICD-10-CM | POA: Insufficient documentation

## 2022-05-12 DIAGNOSIS — K760 Fatty (change of) liver, not elsewhere classified: Secondary | ICD-10-CM | POA: Diagnosis present

## 2022-05-12 MED ORDER — TECHNETIUM TC 99M MEBROFENIN IV KIT
5.0000 | PACK | Freq: Once | INTRAVENOUS | Status: AC | PRN
  Administered 2022-05-12: 5.07 via INTRAVENOUS

## 2022-07-24 ENCOUNTER — Other Ambulatory Visit: Payer: Self-pay | Admitting: Obstetrics & Gynecology

## 2022-07-24 DIAGNOSIS — Z1231 Encounter for screening mammogram for malignant neoplasm of breast: Secondary | ICD-10-CM

## 2022-08-14 ENCOUNTER — Ambulatory Visit
Admission: RE | Admit: 2022-08-14 | Discharge: 2022-08-14 | Disposition: A | Discharge status: Home / Self Care | Payer: Managed Care, Other (non HMO) | Source: Ambulatory Visit | Attending: Obstetrics & Gynecology | Admitting: Obstetrics & Gynecology

## 2022-08-14 DIAGNOSIS — Z1231 Encounter for screening mammogram for malignant neoplasm of breast: Secondary | ICD-10-CM

## 2023-06-10 ENCOUNTER — Other Ambulatory Visit: Payer: Self-pay | Admitting: Obstetrics & Gynecology

## 2023-06-10 DIAGNOSIS — Z1231 Encounter for screening mammogram for malignant neoplasm of breast: Secondary | ICD-10-CM

## 2023-08-17 ENCOUNTER — Ambulatory Visit
Admission: RE | Admit: 2023-08-17 | Discharge: 2023-08-17 | Disposition: A | Discharge status: Home / Self Care | Payer: 59 | Source: Ambulatory Visit | Attending: Obstetrics & Gynecology | Admitting: Obstetrics & Gynecology

## 2023-08-17 DIAGNOSIS — Z1231 Encounter for screening mammogram for malignant neoplasm of breast: Secondary | ICD-10-CM

## 2024-07-25 ENCOUNTER — Other Ambulatory Visit: Payer: Self-pay | Admitting: Obstetrics & Gynecology

## 2024-07-25 DIAGNOSIS — Z1231 Encounter for screening mammogram for malignant neoplasm of breast: Secondary | ICD-10-CM

## 2024-08-17 ENCOUNTER — Ambulatory Visit
Admission: RE | Admit: 2024-08-17 | Discharge: 2024-08-17 | Disposition: A | Discharge status: Home / Self Care | Source: Ambulatory Visit | Attending: Obstetrics & Gynecology

## 2024-08-17 DIAGNOSIS — Z1231 Encounter for screening mammogram for malignant neoplasm of breast: Secondary | ICD-10-CM

## 2024-08-29 ENCOUNTER — Ambulatory Visit: Payer: Self-pay

## 2024-08-29 DIAGNOSIS — Z800 Family history of malignant neoplasm of digestive organs: Secondary | ICD-10-CM | POA: Diagnosis not present

## 2024-08-29 DIAGNOSIS — D125 Benign neoplasm of sigmoid colon: Secondary | ICD-10-CM | POA: Diagnosis not present

## 2024-08-29 DIAGNOSIS — K5730 Diverticulosis of large intestine without perforation or abscess without bleeding: Secondary | ICD-10-CM | POA: Diagnosis not present

## 2024-08-29 DIAGNOSIS — Z1211 Encounter for screening for malignant neoplasm of colon: Secondary | ICD-10-CM | POA: Diagnosis present

## 2024-08-29 DIAGNOSIS — K640 First degree hemorrhoids: Secondary | ICD-10-CM | POA: Diagnosis not present

## 2024-10-16 ENCOUNTER — Other Ambulatory Visit: Payer: Self-pay | Admitting: Nurse Practitioner

## 2024-10-16 DIAGNOSIS — H6500 Acute serous otitis media, unspecified ear: Secondary | ICD-10-CM

## 2024-10-16 MED ORDER — AMOXICILLIN-POT CLAVULANATE 875-125 MG PO TABS: 1.0000 | ORAL_TABLET | Freq: Two times a day (BID) | ORAL | 0 refills | Status: AC

## 2024-11-14 ENCOUNTER — Encounter

## 2024-11-14 ENCOUNTER — Ambulatory Visit: Admitting: Podiatry

## 2024-11-14 DIAGNOSIS — M2042 Other hammer toe(s) (acquired), left foot: Secondary | ICD-10-CM

## 2024-11-14 NOTE — Progress Notes (Signed)
 She presents today chief complaint of a painful fourth toe of her left foot.  She states that has been bothering her on and off for quite some time she feels like there is a burning sensation in the toe.  She states it is not there constantly and the toe nail has a funny curve to it.  Objective: Vital signs are stable she is alert and oriented x 3 pulses are palpable.  She has some plantarflexion and adduction at the DIPJ of the fourth digit of the left foot.  Radiographs taken today do demonstrate some osteoarthritic change at the DIPJ and some flexor contraction of the PIPJ.  Assessment: Flexible hammertoe deformity fourth left.  Plan: Ragona have her back for flexor tenotomy we discussed the etiology pathology conservative versus surgical therapy she chose to do this for now.

## 2024-11-14 NOTE — Progress Notes (Deleted)
 Subjective:  Patient ID: Chelsea Neal, female    DOB: 08-30-1966,  MRN: [id number] HPI Chief Complaint  Patient presents with   Toe Pain    Left foot toe pain     58 y.o. female presents with the above complaint.   ***  Past Medical History:  Diagnosis Date   Anxiety    Diverticulosis    Fibroids    GERD (gastroesophageal reflux disease)    H/O menorrhagia    History of kidney stones    Hypertension    PATIENT DENIES   IBS (irritable bowel syndrome)    constipation   Panic attack    Past Surgical History:  Procedure Laterality Date   BREAST CYST ASPIRATION Left    bucal tumor removal Right    CYSTOSCOPY N/A 11/16/2019   Procedure: CYSTOSCOPY;  Surgeon: Bettina Muskrat, MD;  Location: [facility] OR;  Service: Gynecology;  Laterality: N/A;   CYSTOSCOPY/URETEROSCOPY/HOLMIUM LASER/STENT PLACEMENT Right 10/14/2016   Procedure: CYSTOSCOPY/URETEROSCOPY/HOLMIUM LASER/STENT PLACEMENT;  Surgeon: Rosina Riis, MD;  Location: [facility];  Service: Urology;  Laterality: Right;   hammer toe repair Left    Dr. Magdalen, [facility]   HYSTERECTOMY ABDOMINAL WITH SALPINGECTOMY Bilateral 11/16/2019   Procedure: SUPRACERVICAL ABDOMINAL  WITH BILATERAL SALPINGECTOMY, MYOMECTOMY;  Surgeon: Bettina Muskrat, MD;  Location: [facility] OR;  Service: Gynecology;  Laterality: Bilateral;   LYSIS OF ADHESION N/A 11/16/2019   Procedure: LYSIS OF ADHESIONS;  Surgeon: Bettina Muskrat, MD;  Location: [facility] OR;  Service: Gynecology;  Laterality: N/A;   UPPER GI ENDOSCOPY  11/12/2016   Dr Luellen   Current Medications[1]  Allergies[2] Review of Systems Objective:  There were no vitals filed for this visit.  General: Well developed, nourished, in no acute distress, alert and oriented x3   Dermatological: Skin is warm, dry and supple bilateral. Nails x 10 are well maintained; remaining integument appears unremarkable at this time. There are no open sores, no preulcerative lesions, no rash or signs of infection  present.  Vascular: Dorsalis Pedis artery and Posterior Tibial artery pedal pulses are 2/4 bilateral with immedate capillary fill time. Pedal hair growth present. No varicosities and no lower extremity edema present bilateral.   Neruologic: Grossly intact via light touch bilateral. Vibratory intact via tuning fork bilateral. Protective threshold with Semmes Wienstein monofilament intact to all pedal sites bilateral. Patellar and Achilles deep tendon reflexes 2+ bilateral. No Babinski or clonus noted bilateral.   Musculoskeletal: No gross boney pedal deformities bilateral. No pain, crepitus, or limitation noted with foot and ankle range of motion bilateral. 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  Gait: Unassisted, Nonantalgic.    Radiographs:  ***  Assessment & Plan:   Assessment: ***  Plan: ***     Josealberto Montalto T. Skye Plamondon, DPM    [1]  Current Outpatient Medications:    amLODipine (NORVASC) 5 MG tablet, amlodipine 5 mg tablet, Disp: , Rfl:    cetirizine (ZYRTEC) 10 MG tablet, Take by mouth., Disp: , Rfl:    esomeprazole (NEXIUM) 40 MG capsule, esomeprazole magnesium 40 mg capsule,delayed release, Disp: , Rfl:    ferrous sulfate 325 (65 FE) MG EC tablet, Take 325 mg by mouth 3 (three) times daily with meals. (Patient not taking: Reported on 12/20/2021), Disp: , Rfl:    fluticasone (FLONASE) 50 MCG/ACT nasal spray, fluticasone propionate 50 mcg/actuation nasal spray,suspension  SHAKE LIQUID AND USE 2 SPRAYS IN EACH NOSTRIL EVERY DAY, Disp: , Rfl:    ibuprofen  (ADVIL ) 600  MG tablet, take 1 tablet po pc every 6 hours for 5 days then prn- post operative pain, Disp: 30 tablet, Rfl: 1   meloxicam  (MOBIC ) 15 MG tablet, Take 1 tablet (15 mg total) by mouth daily., Disp: 30 tablet, Rfl: 1   methylPREDNISolone  (MEDROL  DOSEPAK) 4 MG TBPK tablet, 6 day dose pack - take as directed, Disp: 21 tablet, Rfl: 0   phentermine (ADIPEX-P) 37.5 MG tablet, phentermine 37.5  mg tablet  Take 1 tablet every day by oral route for 30 days., Disp: , Rfl:    polyethylene glycol powder (GLYCOLAX/MIRALAX) 17 GM/SCOOP powder, polyethylene glycol 3350 17 gram/dose oral powder  TAKE AS DIRECTED FOR COLON PREP, Disp: , Rfl:    Semaglutide-Weight Management (WEGOVY) 2.4 MG/0.75ML SOAJ, Wegovy 2.4 mg/0.75 mL subcutaneous pen injector  Inject by subcutaneous route for 28 days., Disp: , Rfl:    tirzepatide (MOUNJARO) 2.5 MG/0.5ML Pen, Mounjaro 2.5 mg/0.5 mL subcutaneous pen injector  Inject 2.5 mg every week by subcutaneous route., Disp: , Rfl:    valACYclovir (VALTREX) 500 MG tablet, Take 500 mg by mouth daily as needed (shingles). , Disp: , Rfl:    venlafaxine XR (EFFEXOR-XR) 37.5 MG 24 hr capsule, Take by mouth., Disp: , Rfl:  [2]  Allergies Allergen Reactions   Latex Hives

## 2024-11-28 ENCOUNTER — Ambulatory Visit: Admitting: Podiatry

## 2024-11-28 DIAGNOSIS — M205X2 Other deformities of toe(s) (acquired), left foot: Secondary | ICD-10-CM

## 2024-11-28 DIAGNOSIS — M6240 Contracture of muscle, unspecified site: Secondary | ICD-10-CM

## 2024-11-28 NOTE — Progress Notes (Signed)
 She presents today for a flexor tenotomy fourth digit left foot due to a mallet toe deformity of her fourth toe left foot where she is actually standing on the medial aspect of the fourth toe with her third toe.  Objective: Pulses are strongly palpable left lower extremity mild mallet toe deformity with plantar distal tuft disfiguration due to the third toe sitting on top of the fourth toe.  Assessment: Mallet toe deformity fourth left.  Flexible.  Plan: After oral and written consent were obtained the toe was localized with 2 cc of a 50-50 mixture of Marcaine  plain and lidocaine  with epinephrine.  Once anesthetized the toe was prepped and draped as normal sterile fashion utilizing an 18-gauge needle I was able to transect the distal fibers of the long flexor tendon at the DIPJ plantarly.  The area was copiously lavaged with normal sterile saline.  I then applied Dermabond and a dry sterile compressive digital dressing.  I provided her with more dressing material just in case this were to come off while she is working and she will notify me with questions or concerns.  Otherwise I will follow-up with her in 2 weeks gave her instructions to take the bandage off in 2 to 3 days and start washing it.  I also instructed her to dress it daily to hold the toe straight and not to allow the toe to bend.

## 2024-12-14 ENCOUNTER — Ambulatory Visit: Admitting: Podiatry

## 2024-12-14 DIAGNOSIS — M6240 Contracture of muscle, unspecified site: Secondary | ICD-10-CM

## 2024-12-14 NOTE — Progress Notes (Signed)
 She presents today for follow-up of her flexor tenotomy fourth digit left foot.  She states that is doing pretty good but is a little tender.  Objective: Vital signs are stable alert oriented x 3.  There is some some postinflammatory hyperpigmentation dorsal aspect of the DIPJ but there is no signs of infection.  The wound is closed plantarly there is no purulence no discharge.  There is some tenderness on range of motion at the DIPJ particularly extension.  Assessment: Well-healing surgical toe.  Plan: I encouraged her to dress the end of the toe with some compression to help reshape the fourth fat pad since the third toe will no longer be walking on it.  I will follow-up with her on an as-needed basis.
